# Patient Record
Sex: Male | Born: 1995 | Race: Black or African American | Hispanic: No | Marital: Single | Smoking: Current every day smoker
Health system: Southern US, Community
[De-identification: ages and names within clinical notes are randomized; demographics above are authoritative.]

## PROBLEM LIST (undated history)

## (undated) DIAGNOSIS — A630 Anogenital (venereal) warts: Secondary | ICD-10-CM

## (undated) DIAGNOSIS — A749 Chlamydial infection, unspecified: Secondary | ICD-10-CM

---

## 2018-07-07 ENCOUNTER — Other Ambulatory Visit: Payer: Self-pay

## 2018-07-07 ENCOUNTER — Emergency Department (HOSPITAL_BASED_OUTPATIENT_CLINIC_OR_DEPARTMENT_OTHER)
Admission: EM | Admit: 2018-07-07 | Discharge: 2018-07-08 | Disposition: A | Discharge status: Home / Self Care | Payer: Medicaid Other | Attending: Emergency Medicine | Admitting: Emergency Medicine

## 2018-07-07 DIAGNOSIS — N342 Other urethritis: Secondary | ICD-10-CM | POA: Insufficient documentation

## 2018-07-07 DIAGNOSIS — Z202 Contact with and (suspected) exposure to infections with a predominantly sexual mode of transmission: Secondary | ICD-10-CM | POA: Diagnosis present

## 2018-07-07 NOTE — ED Triage Notes (Signed)
Pt states he is having burning sensation on his groins and with urination and wants to be check for STD

## 2018-07-08 ENCOUNTER — Encounter (HOSPITAL_BASED_OUTPATIENT_CLINIC_OR_DEPARTMENT_OTHER): Payer: Self-pay | Admitting: Emergency Medicine

## 2018-07-08 LAB — URINALYSIS, ROUTINE W REFLEX MICROSCOPIC
Bilirubin Urine: NEGATIVE
Glucose, UA: NEGATIVE mg/dL
Hgb urine dipstick: NEGATIVE
Ketones, ur: NEGATIVE mg/dL
Leukocytes,Ua: NEGATIVE
Nitrite: NEGATIVE
Protein, ur: NEGATIVE mg/dL
Specific Gravity, Urine: >1.03 — ABNORMAL HIGH (ref 1.005–1.030)
pH: 6.5 (ref 5.0–8.0)

## 2018-07-08 MED ORDER — AZITHROMYCIN 250 MG PO TABS
1000.0000 mg | ORAL_TABLET | Freq: Once | ORAL | Status: AC
  Administered 2018-07-08: 1000 mg via ORAL
  Filled 2018-07-08: qty 4

## 2018-07-08 MED ORDER — CEFTRIAXONE SODIUM 250 MG IJ SOLR
250.0000 mg | Freq: Once | INTRAMUSCULAR | Status: AC
  Administered 2018-07-08: 250 mg via INTRAMUSCULAR
  Filled 2018-07-08: qty 250

## 2018-07-08 MED ORDER — LIDOCAINE HCL (PF) 1 % IJ SOLN
INTRAMUSCULAR | Status: AC
  Administered 2018-07-08: 01:00:00 5 mL
  Filled 2018-07-08: qty 5

## 2018-07-08 NOTE — ED Provider Notes (Signed)
[facility] EMERGENCY DEPARTMENT Provider Note   CSN: [id number] Arrival date & time: 07/07/18  2354    History   Chief Complaint Chief Complaint  Patient presents with  . Exposure to STD    HPI Roger Holloway is a 23 y.o. male.     Patient c/o dysuria, and mild penile discharge in past day. +sexually active, no known std exposure. Denies scrotal or testicular pain or swelling. No abd or flank pain. No hematuria. No fever or chills. No nvd. Normal appetite. Denies hx uti. Wants to be checked for std. No specific known ill contacts or std exposure.   The history is provided by the patient.  Exposure to STD  Pertinent negatives include no chest pain, no abdominal pain, no headaches and no shortness of breath.    History reviewed. No pertinent past medical history.  There are no active problems to display for this patient.   History reviewed. No pertinent surgical history.      Home Medications    Prior to Admission medications   Not on File    Family History History reviewed. No pertinent family history.  Social History Social History   Tobacco Use  . Smoking status: Never Smoker  . Smokeless tobacco: Never Used  Substance Use Topics  . Alcohol use: Never    Frequency: Never  . Drug use: Never     Allergies   Patient has no known allergies.   Review of Systems Review of Systems  Constitutional: Negative for fever.  HENT: Negative for sore throat.   Eyes: Negative for redness.  Respiratory: Negative for cough and shortness of breath.   Cardiovascular: Negative for chest pain.  Gastrointestinal: Negative for abdominal pain.  Endocrine: Negative for polyuria.  Genitourinary: Positive for discharge and dysuria. Negative for flank pain.  Musculoskeletal: Negative for back pain.  Skin: Negative for rash.  Neurological: Negative for headaches.  Hematological: Does not bruise/bleed easily.  Psychiatric/Behavioral: Negative for  confusion.     Physical Exam Updated Vital Signs BP 137/72 (BP Location: Right Arm)   Pulse 80   Temp 98.1 F (36.7 C) (Oral)   Resp 18   Ht 1.803 m (5\' 11" )   Wt 81.6 kg   SpO2 96%   BMI 25.10 kg/m   Physical Exam Vitals signs and nursing note reviewed.  Constitutional:      Appearance: Normal appearance. He is well-developed.  HENT:     Head: Atraumatic.     Nose: Nose normal.  Eyes:     General: No scleral icterus.    Conjunctiva/sclera: Conjunctivae normal.  Neck:     Musculoskeletal: Normal range of motion and neck supple.     Trachea: No tracheal deviation.  Cardiovascular:     Rate and Rhythm: Normal rate.     Pulses: Normal pulses.  Pulmonary:     Effort: Pulmonary effort is normal. No accessory muscle usage or respiratory distress.  Abdominal:     General: There is no distension.     Palpations: Abdomen is soft.     Tenderness: There is no abdominal tenderness.  Genitourinary:    Comments: No cva tenderness. Normal external gu exam. No scrotal or testicular pain, swelling or tenderness. Scant penile d/c.  No wounds/ulcers.  Musculoskeletal:        General: No swelling.  Skin:    General: Skin is warm and dry.     Findings: No rash.  Neurological:     Mental Status: He is alert.  Comments: Alert, speech clear.   Psychiatric:        Mood and Affect: Mood normal.      ED Treatments / Results  Labs (all labs ordered are listed, but only abnormal results are displayed) Results for orders placed or performed during the hospital encounter of 07/07/18  Urinalysis, Routine w reflex microscopic  Result Value Ref Range   Color, Urine YELLOW YELLOW   APPearance CLOUDY (A) CLEAR   Specific Gravity, Urine >1.030 (H) 1.005 - 1.030   pH 6.5 5.0 - 8.0   Glucose, UA NEGATIVE NEGATIVE mg/dL   Hgb urine dipstick NEGATIVE NEGATIVE   Bilirubin Urine NEGATIVE NEGATIVE   Ketones, ur NEGATIVE NEGATIVE mg/dL   Protein, ur NEGATIVE NEGATIVE mg/dL   Nitrite  NEGATIVE NEGATIVE   Leukocytes,Ua NEGATIVE NEGATIVE     EKG None  Radiology No results found.  Procedures Procedures (including critical care time)  Medications Ordered in ED Medications  cefTRIAXone (ROCEPHIN) injection 250 mg (has no administration in time range)  azithromycin (ZITHROMAX) tablet 1,000 mg (has no administration in time range)     Initial Impression / Assessment and Plan / ED Course  I have reviewed the triage vital signs and the nursing notes.  Pertinent labs & imaging results that were available during my care of the patient were reviewed by me and considered in my medical decision making (see chart for details).  Labs sent.   Confirmed nkda.   Rocephin im. zitrhomax po.  Reviewed nursing notes and prior charts for additional history.   Labs reviewed by me - ua neg for uti.  rec pcp f/u 1 week.  Return precautions provided.     Final Clinical Impressions(s) / ED Diagnoses   Final diagnoses:  None    ED Discharge Orders    None       Cathren LaineSteinl, Zeth Buday, MD 07/08/18 [phone]

## 2018-07-08 NOTE — Discharge Instructions (Signed)
It was our pleasure to provide your ER care today - we hope that you feel better.  The penile swab tests will take 2-3 days to give results.   You were treated in the ED with antibiotics.   No sex until after symptoms have resolved, then use condoms.  Follow up with primary care doctor in 1 week if symptoms fail to improve/resolve. Also have your blood pressure rechecked then, as it is high today.  Return to ER if worse, new symptoms, fevers, new or severe pain, other concern.

## 2018-07-08 NOTE — ED Notes (Signed)
ED Provider at bedside. 

## 2018-07-08 NOTE — ED Notes (Signed)
No noted rash or reaction from injection

## 2018-07-11 LAB — GC/CHLAMYDIA PROBE AMP (~~LOC~~) NOT AT ARMC
Chlamydia: POSITIVE — AB
Neisseria Gonorrhea: NEGATIVE

## 2018-11-06 ENCOUNTER — Emergency Department (HOSPITAL_BASED_OUTPATIENT_CLINIC_OR_DEPARTMENT_OTHER)
Admission: EM | Admit: 2018-11-06 | Discharge: 2018-11-06 | Disposition: A | Discharge status: Home / Self Care | Payer: Medicaid Other | Attending: Emergency Medicine | Admitting: Emergency Medicine

## 2018-11-06 ENCOUNTER — Encounter (HOSPITAL_BASED_OUTPATIENT_CLINIC_OR_DEPARTMENT_OTHER): Payer: Self-pay | Admitting: *Deleted

## 2018-11-06 ENCOUNTER — Other Ambulatory Visit: Payer: Self-pay

## 2018-11-06 DIAGNOSIS — F17210 Nicotine dependence, cigarettes, uncomplicated: Secondary | ICD-10-CM | POA: Insufficient documentation

## 2018-11-06 DIAGNOSIS — A64 Unspecified sexually transmitted disease: Secondary | ICD-10-CM | POA: Insufficient documentation

## 2018-11-06 DIAGNOSIS — R360 Urethral discharge without blood: Secondary | ICD-10-CM | POA: Diagnosis present

## 2018-11-06 LAB — URINALYSIS, MICROSCOPIC (REFLEX): Squamous Epithelial / HPF: NONE SEEN (ref 0–5)

## 2018-11-06 LAB — URINALYSIS, ROUTINE W REFLEX MICROSCOPIC
Bilirubin Urine: NEGATIVE
Glucose, UA: NEGATIVE mg/dL
Hgb urine dipstick: NEGATIVE
Ketones, ur: NEGATIVE mg/dL
Nitrite: NEGATIVE
Protein, ur: NEGATIVE mg/dL
Specific Gravity, Urine: 1.02 (ref 1.005–1.030)
pH: 7 (ref 5.0–8.0)

## 2018-11-06 MED ORDER — METRONIDAZOLE 500 MG PO TABS
2000.0000 mg | ORAL_TABLET | Freq: Once | ORAL | Status: AC
  Administered 2018-11-06: 2000 mg via ORAL
  Filled 2018-11-06: qty 4

## 2018-11-06 MED ORDER — ONDANSETRON 4 MG PO TBDP
4.0000 mg | ORAL_TABLET | Freq: Once | ORAL | Status: AC
  Administered 2018-11-06: 4 mg via ORAL
  Filled 2018-11-06: qty 1

## 2018-11-06 MED ORDER — AZITHROMYCIN 250 MG PO TABS
1000.0000 mg | ORAL_TABLET | Freq: Once | ORAL | Status: AC
  Administered 2018-11-06: 1000 mg via ORAL
  Filled 2018-11-06: qty 4

## 2018-11-06 MED ORDER — LIDOCAINE HCL (PF) 1 % IJ SOLN
INTRAMUSCULAR | Status: AC
  Administered 2018-11-06: 13:00:00
  Filled 2018-11-06: qty 5

## 2018-11-06 MED ORDER — CEFTRIAXONE SODIUM 250 MG IJ SOLR
250.0000 mg | Freq: Once | INTRAMUSCULAR | Status: AC
  Administered 2018-11-06: 250 mg via INTRAMUSCULAR
  Filled 2018-11-06: qty 250

## 2018-11-06 NOTE — ED Provider Notes (Signed)
[facility] EMERGENCY DEPARTMENT Provider Note   CSN: [id number] Arrival date & time: 11/06/18  1056     History   Chief Complaint No chief complaint on file.   HPI Roger Holloway is a 23 y.o. male.     Patient states he developed a penile discharge yesterday.  Patient with no known STD exposure.  But assumes it is an STD.  No other complaints.  No fever no rash.     History reviewed. No pertinent past medical history.  There are no active problems to display for this patient.   History reviewed. No pertinent surgical history.      Home Medications    Prior to Admission medications   Medication Sig Start Date End Date Taking? Authorizing Provider  valACYclovir (VALTREX) 500 MG tablet Take 500 mg by mouth 2 (two) times daily.    [provider]    Family History No family history on file.  Social History Social History   Tobacco Use  . Smoking status: Current Every Day Smoker  . Smokeless tobacco: Never Used  Substance Use Topics  . Alcohol use: Never    Frequency: Never  . Drug use: Never     Allergies   Patient has no known allergies.   Review of Systems Review of Systems  Constitutional: Negative for chills and fever.  HENT: Negative for congestion, rhinorrhea and sore throat.   Eyes: Negative for visual disturbance.  Respiratory: Negative for cough and shortness of breath.   Cardiovascular: Negative for chest pain and leg swelling.  Gastrointestinal: Negative for abdominal pain, diarrhea, nausea and vomiting.  Genitourinary: Positive for discharge. Negative for difficulty urinating, dysuria, flank pain, genital sores, penile pain, penile swelling, scrotal swelling and testicular pain.  Musculoskeletal: Negative for back pain and neck pain.  Skin: Negative for rash.  Neurological: Negative for dizziness, light-headedness and headaches.  Hematological: Does not bruise/bleed easily.  Psychiatric/Behavioral: Negative for  confusion.     Physical Exam Updated Vital Signs BP 132/79   Pulse 81   Temp 97.9 F (36.6 C) (Oral)   Resp 20   Ht 1.803 m (5\' 11" )   Wt 81.6 kg   SpO2 99%   BMI 25.10 kg/m   Physical Exam Vitals signs and nursing note reviewed.  Constitutional:      Appearance: Normal appearance. He is well-developed.  HENT:     Head: Normocephalic and atraumatic.  Eyes:     Conjunctiva/sclera: Conjunctivae normal.  Neck:     Musculoskeletal: Neck supple.  Cardiovascular:     Rate and Rhythm: Normal rate and regular rhythm.     Heart sounds: No murmur.  Pulmonary:     Effort: Pulmonary effort is normal. No respiratory distress.     Breath sounds: Normal breath sounds.  Abdominal:     Palpations: Abdomen is soft.     Tenderness: There is no abdominal tenderness.  Genitourinary:    Penis: Normal.      Scrotum/Testes: Normal.     Comments: No penile discharge.  Patient is circumcised.  No lesions.  No groin adenopathy.  Testicles nontender.  No evidence of groin hernia. Skin:    General: Skin is warm and dry.  Neurological:     Mental Status: He is alert.      ED Treatments / Results  Labs (all labs ordered are listed, but only abnormal results are displayed) Labs Reviewed  URINALYSIS, ROUTINE W REFLEX MICROSCOPIC - Abnormal; Notable for the following components:  Result Value   Leukocytes,Ua TRACE (*)    All other components within normal limits  URINALYSIS, MICROSCOPIC (REFLEX) - Abnormal; Notable for the following components:   Bacteria, UA MANY (*)    All other components within normal limits  RPR  HIV ANTIBODY (ROUTINE TESTING W REFLEX)    EKG None  Radiology No results found.  Procedures Procedures (including critical care time)  Medications Ordered in ED Medications  cefTRIAXone (ROCEPHIN) injection 250 mg (has no administration in time range)  azithromycin (ZITHROMAX) tablet 1,000 mg (has no administration in time range)  metroNIDAZOLE (FLAGYL)  tablet 2,000 mg (has no administration in time range)  ondansetron (ZOFRAN-ODT) disintegrating tablet 4 mg (has no administration in time range)     Initial Impression / Assessment and Plan / ED Course  I have reviewed the triage vital signs and the nursing notes.  Pertinent labs & imaging results that were available during my care of the patient were reviewed by me and considered in my medical decision making (see chart for details).       Patient with a history of discharge.  No discharge present here today.  Patient refuses to have swab done.  Will treat empirically.  Patient to receive Rocephin azithromycin and Flagyl here.  RPR HIV testing completed.   Final Clinical Impressions(s) / ED Diagnoses   Final diagnoses:  STD (male)    ED Discharge Orders    None       Fredia Sorrow, MD 11/06/18 1239

## 2018-11-06 NOTE — Discharge Instructions (Addendum)
All medications and labs done here today.  Should improve over the next few days.  Return for any new or worse symptoms or if not better.

## 2018-11-06 NOTE — ED Triage Notes (Signed)
Penile discharge x 2 days. No known exposure to an STD.

## 2018-11-06 NOTE — ED Notes (Signed)
Pt refusing to wait the required time following a medication injection. Pt advised to watch for signs and symptoms of allergic reaction and to return or call 9-1-1 if noticed. Discharge written and verbal discharge instructions given to pt.

## 2018-11-07 LAB — HIV ANTIBODY (ROUTINE TESTING W REFLEX): HIV Screen 4th Generation wRfx: NONREACTIVE

## 2018-11-07 LAB — RPR: RPR Ser Ql: NONREACTIVE

## 2019-01-08 ENCOUNTER — Encounter (HOSPITAL_BASED_OUTPATIENT_CLINIC_OR_DEPARTMENT_OTHER): Payer: Self-pay

## 2019-01-08 ENCOUNTER — Other Ambulatory Visit: Payer: Self-pay

## 2019-01-08 DIAGNOSIS — Z202 Contact with and (suspected) exposure to infections with a predominantly sexual mode of transmission: Secondary | ICD-10-CM | POA: Insufficient documentation

## 2019-01-08 DIAGNOSIS — R1030 Lower abdominal pain, unspecified: Secondary | ICD-10-CM | POA: Diagnosis not present

## 2019-01-08 DIAGNOSIS — Z79899 Other long term (current) drug therapy: Secondary | ICD-10-CM | POA: Diagnosis not present

## 2019-01-08 DIAGNOSIS — F17290 Nicotine dependence, other tobacco product, uncomplicated: Secondary | ICD-10-CM | POA: Diagnosis not present

## 2019-01-08 NOTE — ED Triage Notes (Signed)
Pt requesting STD check-denies penile d/c and exposure-NAD-steady gait

## 2019-01-09 ENCOUNTER — Encounter (HOSPITAL_BASED_OUTPATIENT_CLINIC_OR_DEPARTMENT_OTHER): Payer: Self-pay | Admitting: Emergency Medicine

## 2019-01-09 ENCOUNTER — Emergency Department (HOSPITAL_BASED_OUTPATIENT_CLINIC_OR_DEPARTMENT_OTHER)
Admission: EM | Admit: 2019-01-09 | Discharge: 2019-01-09 | Disposition: A | Discharge status: Home / Self Care | Payer: Medicaid Other | Attending: Emergency Medicine | Admitting: Emergency Medicine

## 2019-01-09 ENCOUNTER — Emergency Department (HOSPITAL_BASED_OUTPATIENT_CLINIC_OR_DEPARTMENT_OTHER)
Admission: EM | Admit: 2019-01-09 | Discharge: 2019-01-09 | Disposition: A | Discharge status: Home / Self Care | Payer: Medicaid Other | Source: Home / Self Care | Attending: Emergency Medicine | Admitting: Emergency Medicine

## 2019-01-09 DIAGNOSIS — R590 Localized enlarged lymph nodes: Secondary | ICD-10-CM | POA: Insufficient documentation

## 2019-01-09 DIAGNOSIS — I861 Scrotal varices: Secondary | ICD-10-CM

## 2019-01-09 DIAGNOSIS — Z711 Person with feared health complaint in whom no diagnosis is made: Secondary | ICD-10-CM

## 2019-01-09 DIAGNOSIS — Z79899 Other long term (current) drug therapy: Secondary | ICD-10-CM | POA: Insufficient documentation

## 2019-01-09 DIAGNOSIS — F17290 Nicotine dependence, other tobacco product, uncomplicated: Secondary | ICD-10-CM | POA: Insufficient documentation

## 2019-01-09 HISTORY — DX: Chlamydial infection, unspecified: A74.9

## 2019-01-09 LAB — URINALYSIS, DIPSTICK ONLY
Bilirubin Urine: NEGATIVE
Glucose, UA: NEGATIVE mg/dL
Hgb urine dipstick: NEGATIVE
Ketones, ur: NEGATIVE mg/dL
Leukocytes,Ua: NEGATIVE
Nitrite: NEGATIVE
Protein, ur: NEGATIVE mg/dL
Specific Gravity, Urine: 1.02 (ref 1.005–1.030)
pH: 6 (ref 5.0–8.0)

## 2019-01-09 NOTE — ED Provider Notes (Signed)
[facility] EMERGENCY DEPARTMENT Provider Note   CSN: [id number] Arrival date & time: 01/09/19  1047     History   Chief Complaint Inguinal lymph nodes pain  HPI Roger Holloway is a 23 y.o. male who presented emergency department yesterday being tested for STD, returning to the ED with complaint of inguinal lymph node pain.  He reports he has swollen tender nodes in his bilateral inguinal region for several days.  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  This is why is returning in the morning today.  He was seen here yesterday and evaluated for STD.  He had a urine chlamydia gonorrhea screen sent, the results of which are not back yet.  He was not treated empirically at that time.  He was concerned that he had had a new sexual partner, and that he had had chlamydia in the past, and that he feels like his inguinal pain was similar to the chlamydia.  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  The lesions have since resolved.  He denies nausea, vomiting, fevers, chills, penile drainage.  He denies any testicular pain or swelling.     HPI  Past Medical History:  Diagnosis Date  . Chlamydia     There are no active problems to display for this patient.   History reviewed. No pertinent surgical history.      Home Medications    Prior to Admission medications   Medication Sig Start Date End Date Taking? Authorizing Provider  valACYclovir (VALTREX) 500 MG tablet Take 500 mg by mouth 2 (two) times daily.    [provider]    Family History No family history on file.  Social History Social History   Tobacco Use  . Smoking status: Current Every Day Smoker    Types: Cigars  . Smokeless tobacco: Never Used  Substance Use Topics  . Alcohol use: Never    Frequency: Never  . Drug use: Yes    Types: Marijuana     Allergies   Patient has no known allergies.    Review of Systems Review of Systems  Constitutional: Negative for chills and fever.  Eyes: Negative for photophobia and visual disturbance.  Respiratory: Negative for cough and shortness of breath.   Cardiovascular: Negative for chest pain and palpitations.  Gastrointestinal: Negative for nausea and vomiting.  Genitourinary: Negative for difficulty urinating, discharge, flank pain, penile pain, penile swelling, scrotal swelling and testicular pain.  Skin: Negative for pallor and rash.  Neurological: Negative for syncope and light-headedness.  All other systems reviewed and are negative.    Physical Exam Updated Vital Signs BP 140/75 (BP Location: Right Arm)   Pulse 77   Temp 98.2 F (36.8 C) (Oral)   Resp 16   Ht 5\' 11"  (1.803 m)   Wt 81.6 kg   SpO2 100%   BMI 25.10 kg/m   Physical Exam Vitals signs and nursing note reviewed.  Constitutional:      Appearance: He is well-developed.  HENT:     Head: Normocephalic and atraumatic.  Eyes:     Conjunctiva/sclera: Conjunctivae normal.  Neck:     Musculoskeletal: Neck supple.  Cardiovascular:     Rate and Rhythm: Normal rate and regular rhythm.  Pulmonary:     Effort: Pulmonary effort is normal. No respiratory distress.  Abdominal:     Palpations: Abdomen is  soft.     Tenderness: There is no abdominal tenderness. There is no guarding or rebound.     Comments: +small tender bilateral inguinal lymphadenopathy  Genitourinary:    Comments: Exam performed with nurse chaperone in the room Normal external male genitalia, circumsized male Symmetric and intact bilateral cremasteric reflexes. No penile lesions, rashes, crepitus, drainage. No testicular tenderness or swelling. No epididymal tenderness or swelling. Bilateral varicoceles present. Skin:    General: Skin is warm and dry.  Neurological:     Mental Status: He is alert.  Psychiatric:        Mood and Affect: Mood normal.        Behavior: Behavior normal.      ED  Treatments / Results  Labs (all labs ordered are listed, but only abnormal results are displayed) Labs Reviewed  URINALYSIS, DIPSTICK ONLY    EKG None  Radiology No results found.  Procedures Procedures (including critical care time)  Medications Ordered in ED Medications - No data to display   Initial Impression / Assessment and Plan / ED Course  I have reviewed the triage vital signs and the nursing notes.  Pertinent labs & imaging results that were available during my care of the patient were reviewed by me and considered in my medical decision making (see chart for details).  23 year old male presenting to emergency department bilateral inguinal lymphadenopathy in the setting of recent herpes simplex outbreak approximately 1 week ago.  He has no active herpes lesions visible on his GU exam today.  I explained his lymph nodes are very likely reactive to the infection, and should improve over the next several days.  Advised that he take NSAIDs and ibuprofen for his pain.  He has a urine GC chlamydia test still pending from his visit to the ED last night.  I have a much lower suspicion for an STI at this time.  We agreed to watch and wait for his results rather than initiate antibiotics at this time.  I have a low suspicion for intra-abdominal infection based on my exam.  Finally, we discussed his varicoceles found on his GU exam.  This is a benign condition.  I provided some information to read about.  I do not suspect epididymitis or testicular torsion or abscess based on my exam.     Final Clinical Impressions(s) / ED Diagnoses   Final diagnoses:  Varicocele  Inguinal lymphadenopathy    ED Discharge Orders    None       Wyvonnia Dusky, MD 01/09/19 1210

## 2019-01-09 NOTE — ED Triage Notes (Signed)
Pt c/o lower abd pain, describes as pressure

## 2019-01-09 NOTE — ED Provider Notes (Signed)
[facility] EMERGENCY DEPARTMENT Provider Note  CSN: [id number] Arrival date & time: 01/08/19 2233  Chief Complaint(s) SEXUALLY TRANSMITTED DISEASE  HPI Roger Holloway is a 23 y.o. male who presents with concern for you STD.  Patient is sexually active.  Last sexual encounter was 1 week ago.  Began having lower abdominal discomfort.  Denies any penile discharge or bleeding.  No dysuria or frequency.  No fevers or chills.  No nausea vomiting.  No diarrhea.  Currently asymptomatic.  HPI  Past Medical History History reviewed. No pertinent past medical history. There are no active problems to display for this patient.  Home Medication(s) Prior to Admission medications   Medication Sig Start Date End Date Taking? Authorizing Provider  valACYclovir (VALTREX) 500 MG tablet Take 500 mg by mouth 2 (two) times daily.    [provider]                                                                                                                                    Past Surgical History History reviewed. No pertinent surgical history. Family History No family history on file.  Social History Social History   Tobacco Use  . Smoking status: Current Every Day Smoker    Types: Cigars  . Smokeless tobacco: Never Used  Substance Use Topics  . Alcohol use: Never    Frequency: Never  . Drug use: Yes    Types: Marijuana   Allergies Patient has no known allergies.  Review of Systems Review of Systems As noted above Physical Exam Vital Signs  I have reviewed the triage vital signs BP 128/73 (BP Location: Left Arm)   Pulse 95   Temp 98 F (36.7 C) (Oral)   Resp 18   Ht 5\' 11"  (1.803 m)   Wt 81.6 kg   SpO2 98%   BMI 25.10 kg/m   Physical Exam Vitals signs reviewed.  Constitutional:      General: He is not in acute distress.    Appearance: He is well-developed. He is not diaphoretic.  HENT:     Head: Normocephalic and atraumatic.     Jaw: No trismus.      Right Ear: External ear normal.     Left Ear: External ear normal.     Nose: Nose normal.  Eyes:     General: No scleral icterus.    Conjunctiva/sclera: Conjunctivae normal.  Neck:     Musculoskeletal: Normal range of motion.     Trachea: Phonation normal.  Cardiovascular:     Rate and Rhythm: Normal rate and regular rhythm.  Pulmonary:     Effort: Pulmonary effort is normal. No respiratory distress.     Breath sounds: No stridor.  Abdominal:     General: There is no distension.     Tenderness: There is no abdominal tenderness.  Genitourinary:    Pubic Area: No rash.      Penis: Circumcised.  Scrotum/Testes: Normal.        Right: Tenderness or swelling not present.        Left: Tenderness or swelling not present.  Musculoskeletal: Normal range of motion.  Neurological:     Mental Status: He is alert and oriented to person, place, and time.  Psychiatric:        Behavior: Behavior normal.     ED Results and Treatments Labs (all labs ordered are listed, but only abnormal results are displayed) Labs Reviewed  GC/CHLAMYDIA PROBE AMP ([place]) NOT AT [facility]                                                                                                                         EKG  EKG Interpretation  Date/Time:    Ventricular Rate:    PR Interval:    QRS Duration:   QT Interval:    QTC Calculation:   R Axis:     Text Interpretation:        Radiology No results found.  Pertinent labs & imaging results that were available during my care of the patient were reviewed by me and considered in my medical decision making (see chart for details).  Medications Ordered in ED Medications - No data to display                                                                                                                                  Procedures Procedures  (including critical care time)  Medical Decision Making / ED Course I have reviewed the nursing notes for  this encounter and the patient's prior records (if available in EHR or on provided paperwork).   Roger Holloway was evaluated in Emergency Department on 01/09/2019 for the symptoms described in the history of present illness. He was evaluated in the context of the global COVID-19 pandemic, which necessitated consideration that the patient might be at risk for infection with the SARS-CoV-2 virus that causes COVID-19. Institutional protocols and algorithms that pertain to the evaluation of patients at risk for COVID-19 are in a state of rapid change based on information released by regulatory bodies including the CDC and federal and state organizations. These policies and algorithms were followed during the patient's care in the ED.  Abdominal exam benign.  GU exam benign.  GC/chlamydia urine sent.  No need for treatment at this time.  Will await results and contact  if needed.  The patient appears reasonably screened and/or stabilized for discharge and I doubt any other medical condition or other [facility] requiring further screening, evaluation, or treatment in the ED at this time prior to discharge.  The patient is safe for discharge with strict return precautions.       Final Clinical Impression(s) / ED Diagnoses Final diagnoses:  Concern about STD in male without diagnosis    The patient appears reasonably screened and/or stabilized for discharge and I doubt any other medical condition or other [facility] requiring further screening, evaluation, or treatment in the ED at this time prior to discharge.  Disposition: Discharge  Condition: Good  I have discussed the results, Dx and Tx plan with the patient who expressed understanding and agree(s) with the plan. Discharge instructions discussed at great length. The patient was given strict return precautions who verbalized understanding of the instructions. No further questions at time of discharge.    ED Discharge Orders    None         Follow Up: Department, [facility] [address] [place] [address] [phone]  Go to  As needed      This chart was dictated using voice recognition software.  Despite best efforts to proofread,  errors can occur which can change the documentation meaning.   Nira Connardama, Pedro Eduardo, MD 01/09/19 [phone]

## 2019-01-09 NOTE — Discharge Instructions (Addendum)
You will be contacted if results are positive.  You can check the results on MyChart . Refrain from sexual intercourse for 2 weeks and have all partners evaluated and treated at the local health department. Follow up with the health Department in 2 weeks to confirm effectiveness of treatment and receive additional education/evaluation

## 2019-01-09 NOTE — Discharge Instructions (Addendum)
You can take Motrin, Advil, or ibuprofen at home for your tender lymph nodes.  I suspect that the pain will improve over the course of the next few days.  You can also check your patient chart online over the next 2 to 3 days for the results of your Chlamydia and gonorrhea test.  If you test positive, someone should call you to let you know that your positive need to be treated.  Please note that your urine test today did not show signs of urine tract infection.   Finally, I included information about varicoceles for you to read about.  This is not an emergency condition.  This is simply for your information.

## 2019-01-10 LAB — GC/CHLAMYDIA PROBE AMP (~~LOC~~) NOT AT ARMC
Chlamydia: NEGATIVE
Neisseria Gonorrhea: NEGATIVE

## 2019-01-13 ENCOUNTER — Other Ambulatory Visit: Payer: Self-pay

## 2019-01-13 ENCOUNTER — Encounter: Payer: Self-pay | Admitting: Emergency Medicine

## 2019-01-13 DIAGNOSIS — F1210 Cannabis abuse, uncomplicated: Secondary | ICD-10-CM | POA: Diagnosis not present

## 2019-01-13 DIAGNOSIS — A6002 Herpesviral infection of other male genital organs: Secondary | ICD-10-CM | POA: Diagnosis not present

## 2019-01-13 DIAGNOSIS — F17290 Nicotine dependence, other tobacco product, uncomplicated: Secondary | ICD-10-CM | POA: Insufficient documentation

## 2019-01-13 DIAGNOSIS — Z113 Encounter for screening for infections with a predominantly sexual mode of transmission: Secondary | ICD-10-CM | POA: Diagnosis present

## 2019-01-13 NOTE — ED Triage Notes (Signed)
Patient states that he is here for an STD check. Patient states that he was seen at [facility] on Wed for the same. Patient with complaint of lower abdominal pressure that started on Tuesday.

## 2019-01-14 ENCOUNTER — Emergency Department
Admission: EM | Admit: 2019-01-14 | Discharge: 2019-01-14 | Disposition: A | Discharge status: Home / Self Care | Payer: Medicaid Other | Attending: Emergency Medicine | Admitting: Emergency Medicine

## 2019-01-14 DIAGNOSIS — A6002 Herpesviral infection of other male genital organs: Secondary | ICD-10-CM

## 2019-01-14 HISTORY — DX: Anogenital (venereal) warts: A63.0

## 2019-01-14 LAB — URINALYSIS, COMPLETE (UACMP) WITH MICROSCOPIC
Bacteria, UA: NONE SEEN
Bilirubin Urine: NEGATIVE
Glucose, UA: NEGATIVE mg/dL
Hgb urine dipstick: NEGATIVE
Ketones, ur: NEGATIVE mg/dL
Leukocytes,Ua: NEGATIVE
Nitrite: NEGATIVE
Protein, ur: NEGATIVE mg/dL
Specific Gravity, Urine: 1.02 (ref 1.005–1.030)
Squamous Epithelial / HPF: NONE SEEN (ref 0–5)
WBC, UA: NONE SEEN WBC/hpf (ref 0–5)
pH: 6 (ref 5.0–8.0)

## 2019-01-14 LAB — COMPREHENSIVE METABOLIC PANEL
ALT: 20 U/L (ref 0–44)
AST: 19 U/L (ref 15–41)
Albumin: 4.8 g/dL (ref 3.5–5.0)
Alkaline Phosphatase: 60 U/L (ref 38–126)
Anion gap: 10 (ref 5–15)
BUN: 11 mg/dL (ref 6–20)
CO2: 28 mmol/L (ref 22–32)
Calcium: 9.6 mg/dL (ref 8.9–10.3)
Chloride: 102 mmol/L (ref 98–111)
Creatinine, Ser: 0.96 mg/dL (ref 0.61–1.24)
GFR calc Af Amer: >60 mL/min (ref >60)
GFR calc non Af Amer: >60 mL/min (ref >60)
Glucose, Bld: 103 mg/dL — ABNORMAL HIGH (ref 70–99)
Potassium: 3.9 mmol/L (ref 3.5–5.1)
Sodium: 140 mmol/L (ref 135–145)
Total Bilirubin: 0.8 mg/dL (ref 0.3–1.2)
Total Protein: 8 g/dL (ref 6.5–8.1)

## 2019-01-14 LAB — CBC
HCT: 46.9 % (ref 39.0–52.0)
Hemoglobin: 16.4 g/dL (ref 13.0–17.0)
MCH: 30.8 pg (ref 26.0–34.0)
MCHC: 35 g/dL (ref 30.0–36.0)
MCV: 88.2 fL (ref 80.0–100.0)
Platelets: 254 10*3/uL (ref 150–400)
RBC: 5.32 MIL/uL (ref 4.22–5.81)
RDW: 12.4 % (ref 11.5–15.5)
WBC: 4.1 10*3/uL (ref 4.0–10.5)
nRBC: 0 % (ref 0.0–0.2)

## 2019-01-14 LAB — LIPASE, BLOOD: Lipase: 32 U/L (ref 11–51)

## 2019-01-14 NOTE — ED Provider Notes (Signed)
[facility] Emergency Department Provider Note _____   First MD Initiated Contact with Patient 01/14/19 0222     (approximate)  I have reviewed the triage vital signs and the nursing notes.   HISTORY  Chief Complaint Abdominal Pain    HPI Roger Holloway is a 23 y.o. male with previous history of genital herpes chlamydia genital warts presents to the emergency department requesting to be checked for an STD.  Patient denies any dysuria no penile discharge.  Patient denies any fever no rash.  Patient does admit to unprotected sex 2 weeks ago.  Patient does admit to a recent herpes outbreak for which he is taking Valtrex and states that it has not resolved as quickly as before.     Past Medical History:  Diagnosis Date  . Chlamydia   . Genital warts     There are no active problems to display for this patient.   History reviewed. No pertinent surgical history.  Prior to Admission medications   Medication Sig Start Date End Date Taking? Authorizing Provider  valACYclovir (VALTREX) 500 MG tablet Take 500 mg by mouth 2 (two) times daily.    [provider]    Allergies Patient has no known allergies.  No family history on file.  Social History Social History   Tobacco Use  . Smoking status: Current Every Day Smoker    Types: Cigars  . Smokeless tobacco: Never Used  Substance Use Topics  . Alcohol use: Yes    Frequency: Never  . Drug use: Yes    Types: Marijuana    Review of Systems Constitutional: No fever/chills Eyes: No visual changes. ENT: No sore throat. Cardiovascular: Denies chest pain. Respiratory: Denies shortness of breath. Gastrointestinal: No abdominal pain.  No nausea, no vomiting.  No diarrhea.  No constipation. Genitourinary: Negative for dysuria. Musculoskeletal: Negative for neck pain.  Negative for back pain. Integumentary: Negative for rash. Neurological: Negative for headaches, focal weakness or  numbness.  ____________________________________________   PHYSICAL EXAM:  VITAL SIGNS: ED Triage Vitals [01/13/19 2358]  Enc Vitals Group     BP (!) 146/81     Pulse Rate 90     Resp 18     Temp 98.2 F (36.8 C)     Temp Source Oral     SpO2 98 %     Weight 81.6 kg (180 lb)     Height 1.803 m (5\' 11" )     Head Circumference      Peak Flow      Pain Score 6     Pain Loc      Pain Edu?      Excl. in GC?     Constitutional: Alert and oriented.  Eyes: Conjunctivae are normal.  Mouth/Throat: Patient is wearing a mask. Neck: No stridor.  No meningeal signs.   Cardiovascular: Normal rate, regular rhythm. Good peripheral circulation. Grossly normal heart sounds. Respiratory: Normal respiratory effort.  No retractions. Gastrointestinal: Soft and nontender. No distention.  Genitourinary: Shallow-based ulcers noted on the base of the glans penis consistent with reported recent herpes outbreak Musculoskeletal: No lower extremity tenderness nor edema. No gross deformities of extremities. Neurologic:  Normal speech and language. No gross focal neurologic deficits are appreciated.  Skin:  Skin is warm, dry and intact. Psychiatric: Mood and affect are normal. Speech and behavior are normal.  ____________________________________________   LABS (all labs ordered are listed, but only abnormal results are displayed)  Labs Reviewed  COMPREHENSIVE METABOLIC  PANEL - Abnormal; Notable for the following components:      Result Value   Glucose, Bld 103 (*)    All other components within normal limits  URINALYSIS, COMPLETE (UACMP) WITH MICROSCOPIC - Abnormal; Notable for the following components:   Color, Urine YELLOW (*)    APPearance HAZY (*)    All other components within normal limits  LIPASE, BLOOD  CBC     Procedures   ____________________________________________   INITIAL IMPRESSION / MDM / ASSESSMENT AND PLAN / ED COURSE  As part of my medical decision making, I  reviewed the following data within the electronic MEDICAL RECORD NUMBER   23 year old male presented with above-stated history and physical exam requesting STD evaluation.  Chart review revealed that the patient was checked for gonorrhea and chlamydia on 01/09/2019 and syphilis and HIV in September all of which were negative.      ____________________________________________  FINAL CLINICAL IMPRESSION(S) / ED DIAGNOSES  Final diagnoses:  Herpes genitalis in men     MEDICATIONS GIVEN DURING THIS VISIT:  Medications - No data to display   ED Discharge Orders    None      *Please note:  Olis Kunkle was evaluated in Emergency Department on 01/14/2019 for the symptoms described in the history of present illness. He was evaluated in the context of the global COVID-19 pandemic, which necessitated consideration that the patient might be at risk for infection with the SARS-CoV-2 virus that causes COVID-19. Institutional protocols and algorithms that pertain to the evaluation of patients at risk for COVID-19 are in a state of rapid change based on information released by regulatory bodies including the CDC and federal and state organizations. These policies and algorithms were followed during the patient's care in the ED.  Some ED evaluations and interventions may be delayed as a result of limited staffing during the pandemic.*  Note:  This document was prepared using Dragon voice recognition software and may include unintentional dictation errors.   Gregor Hams, MD 01/14/19 [phone]

## 2019-01-14 NOTE — ED Notes (Signed)
No peripheral IV placed this visit.   Discharge instructions reviewed with patient. Questions fielded by this RN. Patient verbalizes understanding of instructions. Patient discharged home in stable condition per brown. No acute distress noted at time of discharge.   Pt given cell phone and charger from rthe computer plug in

## 2019-01-14 NOTE — ED Notes (Addendum)
ED Provider at bedside.  Pt reports "heaviness feeling" in left lower abdomen, reports herpes outbreak (blisters) last week and pt taking Valtrex for same  Pt reports no pain to abdominal palpation, penis appears with distal ulcers at the glans - dorsal and ventral   Pt reports hx of multiple partners without condom use

## 2019-09-07 ENCOUNTER — Emergency Department (HOSPITAL_BASED_OUTPATIENT_CLINIC_OR_DEPARTMENT_OTHER)
Admission: EM | Admit: 2019-09-07 | Discharge: 2019-09-07 | Disposition: A | Discharge status: Home / Self Care | Payer: Medicaid Other | Attending: Emergency Medicine | Admitting: Emergency Medicine

## 2019-09-07 ENCOUNTER — Encounter (HOSPITAL_BASED_OUTPATIENT_CLINIC_OR_DEPARTMENT_OTHER): Payer: Self-pay | Admitting: Emergency Medicine

## 2019-09-07 ENCOUNTER — Other Ambulatory Visit: Payer: Self-pay

## 2019-09-07 DIAGNOSIS — N342 Other urethritis: Secondary | ICD-10-CM | POA: Diagnosis not present

## 2019-09-07 DIAGNOSIS — R369 Urethral discharge, unspecified: Secondary | ICD-10-CM | POA: Diagnosis present

## 2019-09-07 DIAGNOSIS — F17290 Nicotine dependence, other tobacco product, uncomplicated: Secondary | ICD-10-CM | POA: Insufficient documentation

## 2019-09-07 LAB — HIV ANTIBODY (ROUTINE TESTING W REFLEX): HIV Screen 4th Generation wRfx: NONREACTIVE

## 2019-09-07 LAB — RPR: RPR Ser Ql: NONREACTIVE

## 2019-09-07 MED ORDER — DOXYCYCLINE HYCLATE 100 MG PO CAPS: 100.0000 mg | ORAL_CAPSULE | Freq: Two times a day (BID) | ORAL | 0 refills | Status: DC

## 2019-09-07 MED ORDER — DOXYCYCLINE HYCLATE 100 MG PO TABS
100.0000 mg | ORAL_TABLET | Freq: Once | ORAL | Status: AC
  Administered 2019-09-07: 100 mg via ORAL
  Filled 2019-09-07: qty 1

## 2019-09-07 MED ORDER — LIDOCAINE HCL (PF) 1 % IJ SOLN
1.0000 mL | Freq: Once | INTRAMUSCULAR | Status: AC
  Administered 2019-09-07: 1 mL via INTRADERMAL
  Filled 2019-09-07: qty 5

## 2019-09-07 MED ORDER — CEFTRIAXONE SODIUM 500 MG IJ SOLR
500.0000 mg | Freq: Once | INTRAMUSCULAR | Status: AC
  Administered 2019-09-07: 500 mg via INTRAMUSCULAR
  Filled 2019-09-07: qty 500

## 2019-09-07 NOTE — ED Triage Notes (Signed)
Pt reports penile discharge x1 week. Reports similar to previous chlamydia

## 2019-09-07 NOTE — ED Provider Notes (Signed)
[facility] Provider Note: Lowella Dell, MD, FACEP  CSN: [id number] MRN: [id number] ARRIVAL: 09/07/19 at 0528 ROOM: [facility]   CHIEF COMPLAINT  Penile Discharge   HISTORY OF PRESENT ILLNESS  09/07/19 6:25 AM Roger Holloway is a 24 y.o. male with a 1 week history of a clear urethral discharge.  Symptoms are similar to previous chlamydia.  He has had little dysuria which comes and goes.  It is not severe.  He denies abdominal pain.   Past Medical History:  Diagnosis Date   Chlamydia    Genital warts     History reviewed. No pertinent surgical history.  History reviewed. No pertinent family history.  Social History   Tobacco Use   Smoking status: Current Every Day Smoker    Types: Cigars   Smokeless tobacco: Never Used  Vaping Use   Vaping Use: Never used  Substance Use Topics   Alcohol use: Yes   Drug use: Yes    Types: Marijuana    Prior to Admission medications   Medication Sig Start Date End Date Taking? Authorizing Provider  doxycycline (VIBRAMYCIN) 100 MG capsule Take 1 capsule (100 mg total) by mouth 2 (two) times daily. One po bid x 7 days 09/07/19   Matix Henshaw, MD  valACYclovir (VALTREX) 500 MG tablet Take 500 mg by mouth 2 (two) times daily.    [provider]    Allergies Patient has no known allergies.   REVIEW OF SYSTEMS  Negative except as noted here or in the History of Present Illness.   PHYSICAL EXAMINATION  Initial Vital Signs Blood pressure (!) 131/91, pulse 94, temperature 99 F (37.2 C), temperature source Oral, resp. rate 18, SpO2 97 %.  Examination General: Well-developed, well-nourished male in no acute distress; appearance consistent with age of record HENT: normocephalic; atraumatic Eyes: Normal appearance Neck: supple Heart: regular rate and rhythm Lungs: clear to auscultation bilaterally Abdomen: soft; nondistended; nontender; bowel sounds present GU: Tanner V male, circumcised; no  obvious urethral discharge (patient just urinated) Extremities: No deformity; full range of motion; pulses normal Neurologic: Awake, alert and oriented; motor function intact in all extremities and symmetric; no facial droop Skin: Warm and dry Psychiatric: Normal mood and affect   RESULTS  Summary of this visit's results, reviewed and interpreted by myself:   EKG Interpretation  Date/Time:    Ventricular Rate:    PR Interval:    QRS Duration:   QT Interval:    QTC Calculation:   R Axis:     Text Interpretation:        Laboratory Studies: No results found for this or any previous visit (from the past 24 hour(s)). Imaging Studies: No results found.  ED COURSE and MDM  Nursing notes, initial and subsequent vitals signs, including pulse oximetry, reviewed and interpreted by myself.  Vitals:   09/07/19 0546  BP: (!) 131/91  Pulse: 94  Resp: 18  Temp: 99 F (37.2 C)  TempSrc: Oral  SpO2: 97%   Medications  cefTRIAXone (ROCEPHIN) injection 500 mg (has no administration in time range)  doxycycline (VIBRA-TABS) tablet 100 mg (has no administration in time range)    We will treat for gonorrhea and chlamydia.  We will follow the CDC guidelines for treatment of chlamydia with 1 week of doxycycline twice daily.  The patient states he will be compliant.  PROCEDURES  Procedures   ED DIAGNOSES     ICD-10-CM   1. Urethritis  N34.2  Taos Tapp, Jonny Ruiz, MD 09/07/19 [phone]

## 2019-09-10 LAB — GC/CHLAMYDIA PROBE AMP (~~LOC~~) NOT AT ARMC
Chlamydia: POSITIVE — AB
Comment: NEGATIVE
Comment: NORMAL
Neisseria Gonorrhea: NEGATIVE

## 2020-03-29 ENCOUNTER — Emergency Department (HOSPITAL_BASED_OUTPATIENT_CLINIC_OR_DEPARTMENT_OTHER)
Admission: EM | Admit: 2020-03-29 | Discharge: 2020-03-29 | Disposition: A | Discharge status: Home / Self Care | Payer: Medicaid Other | Attending: Emergency Medicine | Admitting: Emergency Medicine

## 2020-03-29 ENCOUNTER — Other Ambulatory Visit: Payer: Self-pay

## 2020-03-29 ENCOUNTER — Encounter (HOSPITAL_BASED_OUTPATIENT_CLINIC_OR_DEPARTMENT_OTHER): Payer: Self-pay | Admitting: Emergency Medicine

## 2020-03-29 DIAGNOSIS — F17290 Nicotine dependence, other tobacco product, uncomplicated: Secondary | ICD-10-CM | POA: Diagnosis not present

## 2020-03-29 DIAGNOSIS — Z202 Contact with and (suspected) exposure to infections with a predominantly sexual mode of transmission: Secondary | ICD-10-CM | POA: Diagnosis not present

## 2020-03-29 DIAGNOSIS — R369 Urethral discharge, unspecified: Secondary | ICD-10-CM | POA: Diagnosis present

## 2020-03-29 DIAGNOSIS — N341 Nonspecific urethritis: Secondary | ICD-10-CM | POA: Diagnosis not present

## 2020-03-29 DIAGNOSIS — N342 Other urethritis: Secondary | ICD-10-CM

## 2020-03-29 LAB — URINALYSIS, ROUTINE W REFLEX MICROSCOPIC
Bilirubin Urine: NEGATIVE
Glucose, UA: NEGATIVE mg/dL
Hgb urine dipstick: NEGATIVE
Ketones, ur: 40 mg/dL — AB
Leukocytes,Ua: NEGATIVE
Nitrite: NEGATIVE
Protein, ur: NEGATIVE mg/dL
Specific Gravity, Urine: >1.03 — ABNORMAL HIGH (ref 1.005–1.030)
pH: 6 (ref 5.0–8.0)

## 2020-03-29 MED ORDER — DOXYCYCLINE HYCLATE 100 MG PO CAPS: 100.0000 mg | ORAL_CAPSULE | Freq: Two times a day (BID) | ORAL | 0 refills | Status: DC

## 2020-03-29 MED ORDER — LIDOCAINE HCL 1 % IJ SOLN: 1.0000 mg | Freq: Once | INTRAMUSCULAR | Status: AC

## 2020-03-29 MED ORDER — CEFTRIAXONE SODIUM 500 MG IJ SOLR
500.0000 mg | Freq: Once | INTRAMUSCULAR | Status: AC
  Administered 2020-03-29: 500 mg via INTRAMUSCULAR
  Filled 2020-03-29: qty 500

## 2020-03-29 MED ORDER — LIDOCAINE HCL (PF) 1 % IJ SOLN
INTRAMUSCULAR | Status: AC
  Administered 2020-03-29: 1 mg via INTRADERMAL
  Filled 2020-03-29: qty 5

## 2020-03-29 MED ORDER — DOXYCYCLINE HYCLATE 100 MG PO TABS
100.0000 mg | ORAL_TABLET | Freq: Once | ORAL | Status: AC
  Administered 2020-03-29: 100 mg via ORAL
  Filled 2020-03-29: qty 1

## 2020-03-29 NOTE — ED Provider Notes (Signed)
[facility] EMERGENCY DEPARTMENT Provider Note   CSN: [id number] Arrival date & time: 03/29/20  0242     History Chief Complaint  Patient presents with  . Exposure to STD    Roger Holloway is a 25 y.o. male.  Patient with history of gonorrhea, chlamydia and genital herpes here with a 4-day history of soreness to his bilateral thighs after having intercourse.  This is since resolved.  Subsequent to this, he developed a "funny feeling" to his left groin and lower abdomen which is also resolved but continues to come and go.  He describes a remittent soreness, and burning.  Denies any pain with urination or blood in the urine.  Has had some clear penile discharge.  No fever or vomiting.  No focal weakness, numbness or tingling.  No back pain or abdominal pain. Does admit to new sexual partner for Has not seen any herpes vesicles  The history is provided by the patient.  Exposure to STD Pertinent negatives include no chest pain, no abdominal pain, no headaches and no shortness of breath.       Past Medical History:  Diagnosis Date  . Chlamydia   . Genital warts     There are no problems to display for this patient.   History reviewed. No pertinent surgical history.     History reviewed. No pertinent family history.  Social History   Tobacco Use  . Smoking status: Current Every Day Smoker    Types: Cigars  . Smokeless tobacco: Never Used  Vaping Use  . Vaping Use: Never used  Substance Use Topics  . Alcohol use: Yes  . Drug use: Yes    Types: Marijuana    Home Medications Prior to Admission medications   Medication Sig Start Date End Date Taking? Authorizing Provider  doxycycline (VIBRAMYCIN) 100 MG capsule Take 1 capsule (100 mg total) by mouth 2 (two) times daily. One po bid x 7 days 09/07/19   Molpus, John, MD  valACYclovir (VALTREX) 500 MG tablet Take 500 mg by mouth 2 (two) times daily.    [provider]    Allergies    Patient has  no known allergies.  Review of Systems   Review of Systems  Constitutional: Negative for activity change, appetite change and fever.  HENT: Negative for congestion and rhinorrhea.   Respiratory: Negative for cough, chest tightness and shortness of breath.   Cardiovascular: Negative for chest pain.  Gastrointestinal: Negative for abdominal pain, nausea and vomiting.  Genitourinary: Positive for genital sores, penile discharge and penile pain. Negative for dysuria and testicular pain.  Musculoskeletal: Positive for arthralgias and myalgias. Negative for back pain.  Skin: Negative for rash.  Neurological: Negative for dizziness, weakness and headaches.   all other systems are negative except as noted in the HPI and PMH.    Physical Exam Updated Vital Signs BP 118/69 (BP Location: Right Arm)   Pulse (!) 115   Temp 98.3 F (36.8 C) (Oral)   Resp 20   Ht 5\' 11"  (1.803 m)   Wt 81.6 kg   SpO2 100%   BMI 25.10 kg/m   Physical Exam Vitals and nursing note reviewed.  Constitutional:      General: He is not in acute distress.    Appearance: He is well-developed and well-nourished.  HENT:     Head: Normocephalic and atraumatic.     Mouth/Throat:     Mouth: Oropharynx is clear and moist.     Pharynx: No oropharyngeal  exudate.  Eyes:     Extraocular Movements: EOM normal.     Conjunctiva/sclera: Conjunctivae normal.     Pupils: Pupils are equal, round, and reactive to light.  Neck:     Comments: No meningismus. Cardiovascular:     Rate and Rhythm: Normal rate and regular rhythm.     Pulses: Intact distal pulses.     Heart sounds: Normal heart sounds. No murmur heard.   Pulmonary:     Effort: Pulmonary effort is normal. No respiratory distress.     Breath sounds: Normal breath sounds.  Abdominal:     Palpations: Abdomen is soft.     Tenderness: There is no abdominal tenderness. There is no guarding or rebound.  Genitourinary:    Comments: Circumcised, no vesicles seen.  No  rashes or genital lesions.  No lymphadenopathy.  No testicular tenderness Musculoskeletal:        General: No tenderness or edema. Normal range of motion.     Cervical back: Normal range of motion and neck supple.  Skin:    General: Skin is warm.  Neurological:     Mental Status: He is alert and oriented to person, place, and time.     Cranial Nerves: No cranial nerve deficit.     Motor: No abnormal muscle tone.     Coordination: Coordination normal.     Comments: No ataxia on finger to nose bilaterally. No pronator drift. 5/5 strength throughout. CN 2-12 intact.Equal grip strength. Sensation intact.   Psychiatric:        Mood and Affect: Mood and affect normal.        Behavior: Behavior normal.     ED Results / Procedures / Treatments   Labs (all labs ordered are listed, but only abnormal results are displayed) Labs Reviewed  URINALYSIS, ROUTINE W REFLEX MICROSCOPIC - Abnormal; Notable for the following components:      Result Value   Specific Gravity, Urine >1.030 (*)    Ketones, ur 40 (*)    All other components within normal limits  URINE CULTURE    EKG None  Radiology No results found.  Procedures Procedures   Medications Ordered in ED Medications - No data to display  ED Course  I have reviewed the triage vital signs and the nursing notes.  Pertinent labs & imaging results that were available during my care of the patient were reviewed by me and considered in my medical decision making (see chart for details).    MDM Rules/Calculators/A&P                         Groin discomfort with history of STDs and recent unprotected sex with penile discharge.  Lower extremities are neurologically intact.  Low suspicion for cord compression or cauda equina.  Equal distal pulses, reflexes and strength and sensation.  Discussed possibility of herpes outbreak yet not visible.  We will treat empirically for gonorrhea and chlamydia and urethritis.  Safe sex practices  discussed Followup with health department.  Return precautions discussed Final Clinical Impression(s) / ED Diagnoses Final diagnoses:  Urethritis    Rx / DC Orders ED Discharge Orders    None       Tracer Gutridge, Jeannett Senior, MD 03/29/20 [phone]

## 2020-03-29 NOTE — ED Triage Notes (Signed)
Pt state had soreness in legs that progressed to groin discomfort and burning, no burning with urination but has seen clear discharge.

## 2020-03-29 NOTE — ED Notes (Signed)
Assumed care of this patient. Pt reports noticing pain in groin area with discharge since Sunday. Vitals taken. A&Ox4. Medicated per MAR. NAD. Connected to BP and Pulse Ox. Will continue to monitor.

## 2020-03-29 NOTE — Discharge Instructions (Signed)
Use a condom with every sexual encounter.  Take the antibiotics as prescribed.  Follow-up with health department.  Return to the ED with new or worsening symptoms.

## 2020-03-30 LAB — URINE CULTURE: Culture: NO GROWTH

## 2020-05-13 ENCOUNTER — Emergency Department (HOSPITAL_BASED_OUTPATIENT_CLINIC_OR_DEPARTMENT_OTHER)
Admission: EM | Admit: 2020-05-13 | Discharge: 2020-05-13 | Disposition: A | Discharge status: Home / Self Care | Payer: Medicaid Other | Attending: Emergency Medicine | Admitting: Emergency Medicine

## 2020-05-13 ENCOUNTER — Encounter (HOSPITAL_BASED_OUTPATIENT_CLINIC_OR_DEPARTMENT_OTHER): Payer: Self-pay

## 2020-05-13 ENCOUNTER — Other Ambulatory Visit: Payer: Self-pay

## 2020-05-13 DIAGNOSIS — F17290 Nicotine dependence, other tobacco product, uncomplicated: Secondary | ICD-10-CM | POA: Diagnosis not present

## 2020-05-13 DIAGNOSIS — R040 Epistaxis: Secondary | ICD-10-CM

## 2020-05-13 MED ORDER — SALINE SPRAY 0.65 % NA SOLN: 1.0000 | NASAL | 0 refills | Status: AC | PRN

## 2020-05-13 MED ORDER — AFRIN NASAL SPRAY 0.05 % NA SOLN: 1.0000 | Freq: Two times a day (BID) | NASAL | 0 refills | Status: AC | PRN

## 2020-05-13 NOTE — Discharge Instructions (Signed)
If you have any significant persistent nosebleed, spray Afrin into that nostril twice and hold pressure as instructed for 10 minutes.  Repeat this 2 times.  If you having difficulty stopping nosebleed please come back for evaluation.  Otherwise use nasal saline spray as needed to help with providing moisture to your nostrils.  Please avoid any nose picking or trauma to the nose or blowing your nose if not needed.

## 2020-05-13 NOTE — ED Provider Notes (Signed)
[facility] EMERGENCY DEPARTMENT Provider Note   CSN: [id number] Arrival date & time: 05/13/20  1100     History Chief Complaint  Patient presents with  . Epistaxis    Roger Holloway is a 25 y.o. male.   Epistaxis Location:  L nare Severity:  Mild Progression:  Resolved Chronicity:  Recurrent Relieved by:  Nothing Worsened by:  Nothing Associated symptoms: congestion   Associated symptoms: no sore throat   Risk factors: no frequent nosebleeds        Past Medical History:  Diagnosis Date  . Chlamydia   . Genital warts     There are no problems to display for this patient.   History reviewed. No pertinent surgical history.     History reviewed. No pertinent family history.  Social History   Tobacco Use  . Smoking status: Current Every Day Smoker    Types: Cigars  . Smokeless tobacco: Never Used  Vaping Use  . Vaping Use: Never used  Substance Use Topics  . Alcohol use: Yes  . Drug use: Yes    Types: Marijuana    Home Medications Prior to Admission medications   Medication Sig Start Date End Date Taking? Authorizing Provider  oxymetazoline (AFRIN NASAL SPRAY) 0.05 % nasal spray Place 1 spray into both nostrils 2 (two) times daily as needed for congestion (USE FOR NOSEBLEEDS). 05/13/20  Yes Luticia Tadros, DO  sodium chloride (OCEAN) 0.65 % SOLN nasal spray Place 1 spray into both nostrils as needed for congestion. 05/13/20  Yes Cheril Slattery, DO  doxycycline (VIBRAMYCIN) 100 MG capsule Take 1 capsule (100 mg total) by mouth 2 (two) times daily. 03/29/20   Rancour, Jeannett Senior, MD  valACYclovir (VALTREX) 500 MG tablet Take 500 mg by mouth 2 (two) times daily.    [provider]    Allergies    Patient has no known allergies.  Review of Systems   Review of Systems  HENT: Positive for congestion and nosebleeds. Negative for sore throat.   Hematological: Does not bruise/bleed easily.    Physical Exam Updated Vital Signs  ED  Triage Vitals  Enc Vitals Group     BP 05/13/20 1110 (!) 143/92     Pulse Rate 05/13/20 1110 (!) 106     Resp 05/13/20 1110 18     Temp 05/13/20 1115 98.3 F (36.8 C)     Temp Source 05/13/20 1115 Oral     SpO2 05/13/20 1110 100 %     Weight 05/13/20 1112 180 lb 12.4 oz (82 kg)     Height 05/13/20 1112 5\' 11"  (1.803 m)     Head Circumference --      Peak Flow --      Pain Score 05/13/20 1110 0     Pain Loc --      Pain Edu? --      Excl. in GC? --     Physical Exam Constitutional:      General: He is not in acute distress. HENT:     Nose: Congestion present.     Comments: Left turbinate swollen and red, no nasal polyps, no active nosebleed Neurological:     Mental Status: He is alert.     ED Results / Procedures / Treatments   Labs (all labs ordered are listed, but only abnormal results are displayed) Labs Reviewed - No data to display  EKG None  Radiology No results found.  Procedures Procedures   Medications Ordered in ED Medications - No  data to display  ED Course  I have reviewed the triage vital signs and the nursing notes.  Pertinent labs & imaging results that were available during my care of the patient were reviewed by me and considered in my medical decision making (see chart for details).    MDM Rules/Calculators/A&P                          Zong Weissberg is a 25 year old male who presents the ED with nosebleed.  Has resolved.  Has had a lot of congestion/allergy type symptoms.  Left turbinate is slightly swollen and red.  No active bleeding.  Educated about how to control nosebleeds.  Will prescribe Afrin to use as needed.  Will prescribe nasal saline spray.  No history of bleeding issues.  Blood pressure overall unremarkable.  Given reassurance and discharged in ED in good condition.  This chart was dictated using voice recognition software.  Despite best efforts to proofread,  errors can occur which can change the documentation meaning.     Final Clinical Impression(s) / ED Diagnoses Final diagnoses:  Epistaxis    Rx / DC Orders ED Discharge Orders         Ordered    oxymetazoline (AFRIN NASAL SPRAY) 0.05 % nasal spray  2 times daily PRN        05/13/20 1117    sodium chloride (OCEAN) 0.65 % SOLN nasal spray  As needed        05/13/20 1117           Diamond Jentz, DO 05/13/20 1120

## 2020-05-13 NOTE — ED Triage Notes (Signed)
Pt arrives c/o frequent nose bleeds and congestion. Pt states when he blows his nose it starts bleeding again. Bleeding controlled during triage.

## 2020-09-22 ENCOUNTER — Emergency Department (HOSPITAL_BASED_OUTPATIENT_CLINIC_OR_DEPARTMENT_OTHER)
Admission: EM | Admit: 2020-09-22 | Discharge: 2020-09-22 | Disposition: A | Discharge status: Home / Self Care | Payer: BC Managed Care – PPO | Attending: Emergency Medicine | Admitting: Emergency Medicine

## 2020-09-22 ENCOUNTER — Encounter (HOSPITAL_BASED_OUTPATIENT_CLINIC_OR_DEPARTMENT_OTHER): Payer: Self-pay | Admitting: Urology

## 2020-09-22 ENCOUNTER — Emergency Department (HOSPITAL_BASED_OUTPATIENT_CLINIC_OR_DEPARTMENT_OTHER): Payer: BC Managed Care – PPO

## 2020-09-22 DIAGNOSIS — F17290 Nicotine dependence, other tobacco product, uncomplicated: Secondary | ICD-10-CM | POA: Insufficient documentation

## 2020-09-22 DIAGNOSIS — S99921A Unspecified injury of right foot, initial encounter: Secondary | ICD-10-CM | POA: Insufficient documentation

## 2020-09-22 DIAGNOSIS — Y990 Civilian activity done for income or pay: Secondary | ICD-10-CM | POA: Diagnosis not present

## 2020-09-22 DIAGNOSIS — W231XXA Caught, crushed, jammed, or pinched between stationary objects, initial encounter: Secondary | ICD-10-CM | POA: Diagnosis not present

## 2020-09-22 DIAGNOSIS — S9031XA Contusion of right foot, initial encounter: Secondary | ICD-10-CM

## 2020-09-22 MED ORDER — IBUPROFEN 800 MG PO TABS
800.0000 mg | ORAL_TABLET | Freq: Once | ORAL | Status: AC
  Administered 2020-09-22: 800 mg via ORAL
  Filled 2020-09-22: qty 1

## 2020-09-22 NOTE — ED Notes (Signed)
Pt via pov from work with right foot pain after a pallet jack ran over it. Pt has some swelling to the upper part of his foot. Pt denies any difficulty walking. Pt alert & oriented, nad noted.

## 2020-09-22 NOTE — ED Triage Notes (Signed)
Foot got caught under wheel of forklift at 1700,  no swelling noted, able to bear wt

## 2020-09-22 NOTE — ED Notes (Signed)
Pt discharged home after verbalizing understanding of discharge instructions; nad noted. 

## 2020-09-22 NOTE — ED Provider Notes (Signed)
[facility] EMERGENCY DEPARTMENT Provider Note   CSN: [id number] Arrival date & time: 09/22/20  2127     History Chief Complaint  Patient presents with   Foot Injury    Franki Stemen is a 25 y.o. male presenting with right foot injury.  Patient states that he was at work earlier and was using a Neurosurgeon and his right foot got caught underneath it.  He states that this happened around 5 PM.  He states that he noticed increasing swelling since then.  He is able to walk.  No meds prior to arrival.  Denies any other injuries.  The history is provided by the patient.      Past Medical History:  Diagnosis Date   Chlamydia    Genital warts     There are no problems to display for this patient.   History reviewed. No pertinent surgical history.     History reviewed. No pertinent family history.  Social History   Tobacco Use   Smoking status: Every Day    Types: Cigars   Smokeless tobacco: Never  Vaping Use   Vaping Use: Never used  Substance Use Topics   Alcohol use: Yes   Drug use: Not Currently    Types: Marijuana    Home Medications Prior to Admission medications   Medication Sig Start Date End Date Taking? Authorizing Provider  doxycycline (VIBRAMYCIN) 100 MG capsule Take 1 capsule (100 mg total) by mouth 2 (two) times daily. 03/29/20   Rancour, Jeannett Senior, MD  oxymetazoline (AFRIN NASAL SPRAY) 0.05 % nasal spray Place 1 spray into both nostrils 2 (two) times daily as needed for congestion (USE FOR NOSEBLEEDS). 05/13/20   Curatolo, Adam, DO  sodium chloride (OCEAN) 0.65 % SOLN nasal spray Place 1 spray into both nostrils as needed for congestion. 05/13/20   Curatolo, Adam, DO  valACYclovir (VALTREX) 500 MG tablet Take 500 mg by mouth 2 (two) times daily.    [provider]    Allergies    Patient has no known allergies.  Review of Systems   Review of Systems  Musculoskeletal:        Right foot pain  All other systems reviewed and are  negative.  Physical Exam Updated Vital Signs BP 128/71 (BP Location: Right Arm)   Pulse 83   Temp 98.4 F (36.9 C) (Oral)   Resp 18   Ht 5\' 11"  (1.803 m)   Wt 82 kg   SpO2 97%   BMI 25.21 kg/m   Physical Exam Vitals and nursing note reviewed.  HENT:     Head: Normocephalic and atraumatic.     Nose: Nose normal.     Mouth/Throat:     Mouth: Mucous membranes are moist.  Eyes:     Extraocular Movements: Extraocular movements intact.     Pupils: Pupils are equal, round, and reactive to light.  Cardiovascular:     Rate and Rhythm: Normal rate.     Pulses: Normal pulses.     Heart sounds: Normal heart sounds.  Pulmonary:     Effort: Pulmonary effort is normal.     Breath sounds: Normal breath sounds.  Abdominal:     General: Abdomen is flat.     Palpations: Abdomen is soft.  Musculoskeletal:     Cervical back: Normal range of motion.     Comments: Tenderness over right second metatarsal area, patient has 2+ DP pulses.  Able to wiggle toes.  Patient able to bear weight on the  right leg.  Skin:    General: Skin is warm.     Capillary Refill: Capillary refill takes less than 2 seconds.  Neurological:     General: No focal deficit present.     Mental Status: He is alert and oriented to person, place, and time.  Psychiatric:        Mood and Affect: Mood normal.        Behavior: Behavior normal.    ED Results / Procedures / Treatments   Labs (all labs ordered are listed, but only abnormal results are displayed) Labs Reviewed - No data to display  EKG None  Radiology No results found.  Procedures Procedures   Medications Ordered in ED Medications  ibuprofen (ADVIL) tablet 800 mg (has no administration in time range)    ED Course  I have reviewed the triage vital signs and the nursing notes.  Pertinent labs & imaging results that were available during my care of the patient were reviewed by me and considered in my medical decision making (see chart for  details).    MDM Rules/Calculators/A&P                          Andrej Mundorf is a 25 y.o. male here presenting with right foot injury.  X-ray did not show any fracture.  I think likely contusion.  Stable for discharge.    Final Clinical Impression(s) / ED Diagnoses Final diagnoses:  None    Rx / DC Orders ED Discharge Orders     None        Charlynne Pander, MD 09/22/20 2256

## 2020-09-22 NOTE — Discharge Instructions (Addendum)
Your x-ray today did not show a fracture.  You likely bruised your foot. You can take Tylenol or Motrin for pain.  Expect some swelling and pain tomorrow  See your doctor for follow-up  Return to ER if you have worse right foot pain, unable to walk

## 2020-12-30 ENCOUNTER — Encounter (HOSPITAL_BASED_OUTPATIENT_CLINIC_OR_DEPARTMENT_OTHER): Payer: Self-pay

## 2020-12-30 ENCOUNTER — Other Ambulatory Visit (HOSPITAL_BASED_OUTPATIENT_CLINIC_OR_DEPARTMENT_OTHER): Payer: Self-pay

## 2020-12-30 ENCOUNTER — Emergency Department (HOSPITAL_BASED_OUTPATIENT_CLINIC_OR_DEPARTMENT_OTHER)
Admission: EM | Admit: 2020-12-30 | Discharge: 2020-12-30 | Disposition: A | Discharge status: Home / Self Care | Payer: BC Managed Care – PPO | Attending: Emergency Medicine | Admitting: Emergency Medicine

## 2020-12-30 ENCOUNTER — Other Ambulatory Visit: Payer: Self-pay

## 2020-12-30 DIAGNOSIS — M546 Pain in thoracic spine: Secondary | ICD-10-CM | POA: Diagnosis present

## 2020-12-30 DIAGNOSIS — F17290 Nicotine dependence, other tobacco product, uncomplicated: Secondary | ICD-10-CM | POA: Diagnosis not present

## 2020-12-30 MED ORDER — METHOCARBAMOL 500 MG PO TABS
500.0000 mg | ORAL_TABLET | Freq: Two times a day (BID) | ORAL | 0 refills | Status: AC | PRN
  Filled 2020-12-30: qty 10, 5d supply, fill #0

## 2020-12-30 NOTE — ED Triage Notes (Signed)
Pt presents to ED from home C/O L lower back pain after lifting boxes today. Reports chronic back pain.

## 2020-12-30 NOTE — ED Provider Notes (Signed)
[facility] EMERGENCY DEPARTMENT Provider Note   CSN: [id number] Arrival date & time: 12/30/20  1427     History Chief Complaint  Patient presents with   Back Pain    Roger Holloway is a 25 y.o. male.  HPI Patient is a 25 year old male who presents to the emergency department due to back pain.  Patient states he was performing heavy lifting at work and began experiencing his pain.  States it is along the left mid back.  Worsens with movement.  No numbness, weakness, bowel or bladder incontinence.  Reports a history of similar symptoms in the past.    Past Medical History:  Diagnosis Date   Chlamydia    Genital warts     There are no problems to display for this patient.   History reviewed. No pertinent surgical history.     No family history on file.  Social History   Tobacco Use   Smoking status: Every Day    Types: Cigars   Smokeless tobacco: Never  Vaping Use   Vaping Use: Never used  Substance Use Topics   Alcohol use: Yes    Comment: Occasionally   Drug use: Yes    Types: Marijuana    Home Medications Prior to Admission medications   Medication Sig Start Date End Date Taking? Authorizing Provider  methocarbamol (ROBAXIN) 500 MG tablet Take 1 tablet (500 mg total) by mouth 2 (two) times daily as needed for muscle spasms. 12/30/20  Yes Placido Sou, PA-C  doxycycline (VIBRAMYCIN) 100 MG capsule Take 1 capsule (100 mg total) by mouth 2 (two) times daily. 03/29/20   Rancour, Jeannett Senior, MD  oxymetazoline (AFRIN NASAL SPRAY) 0.05 % nasal spray Place 1 spray into both nostrils 2 (two) times daily as needed for congestion (USE FOR NOSEBLEEDS). 05/13/20   Curatolo, Adam, DO  sodium chloride (OCEAN) 0.65 % SOLN nasal spray Place 1 spray into both nostrils as needed for congestion. 05/13/20   Curatolo, Adam, DO  valACYclovir (VALTREX) 500 MG tablet Take 500 mg by mouth 2 (two) times daily.    [provider]    Allergies    Patient has no  known allergies.  Review of Systems   Review of Systems  Musculoskeletal:  Positive for back pain and myalgias. Negative for neck pain.  Neurological:  Negative for weakness and numbness.   Physical Exam Updated Vital Signs BP (!) 148/87 (BP Location: Right Arm)   Pulse 83   Temp 97.8 F (36.6 C) (Oral)   Resp 12   Ht 5\' 10"  (1.778 m)   Wt 89.4 kg   SpO2 99%   BMI 28.27 kg/m   Physical Exam Vitals and nursing note reviewed.  Constitutional:      General: He is not in acute distress.    Appearance: He is well-developed.  HENT:     Head: Normocephalic and atraumatic.     Right Ear: External ear normal.     Left Ear: External ear normal.  Eyes:     General: No scleral icterus.       Right eye: No discharge.        Left eye: No discharge.     Conjunctiva/sclera: Conjunctivae normal.  Neck:     Trachea: No tracheal deviation.  Cardiovascular:     Rate and Rhythm: Normal rate.  Pulmonary:     Effort: Pulmonary effort is normal. No respiratory distress.     Breath sounds: No stridor.  Abdominal:     General:  There is no distension.  Musculoskeletal:        General: Tenderness present. No swelling or deformity.     Cervical back: Neck supple.     Comments: Mild tenderness noted along the left thoracic paraspinal musculature.  No midline spine pain.  Skin:    General: Skin is warm and dry.     Findings: No rash.  Neurological:     General: No focal deficit present.     Mental Status: He is alert and oriented to person, place, and time.     Cranial Nerves: Cranial nerve deficit: no gross deficits.     Comments: Strength is 5/5 in the bilateral lower extremities.  2+ patellar DTRs.  Distal sensation intact.  2+ DP pulses.   ED Results / Procedures / Treatments   Labs (all labs ordered are listed, but only abnormal results are displayed) Labs Reviewed - No data to display  EKG None  Radiology No results found.  Procedures Procedures   Medications Ordered in  ED Medications - No data to display  ED Course  I have reviewed the triage vital signs and the nursing notes.  Pertinent labs & imaging results that were available during my care of the patient were reviewed by me and considered in my medical decision making (see chart for details).    MDM Rules/Calculators/A&P                          Patient is a 25 year old male who presents to the emergency department due to left-sided back pain that began after performing heavy lifting earlier today.   Physical exam significant for mild tenderness to the left thoracic paraspinal musculature.  No midline spine pain.  Neurovascularly intact in the bilateral lower extremities.  Patient denies any numbness, weakness, bowel or bladder incontinence.  Doubt cauda equina at this time.  No red flags.  Will discharge on a course of Robaxin.  We discussed safety regarding this medication.  He denies a history of seizures.  Recommended continued use of Tylenol as well as ibuprofen.  We discussed dosing.  We discussed return precautions.  Feel that he is stable for discharge at this time and he is agreeable.  His questions were answered and he was amicable at the time of discharge.  Final Clinical Impression(s) / ED Diagnoses Final diagnoses:  Acute left-sided thoracic back pain   Rx / DC Orders ED Discharge Orders          Ordered    methocarbamol (ROBAXIN) 500 MG tablet  2 times daily PRN        12/30/20 1459             Placido Sou, PA-C 12/30/20 1506    Virgina Norfolk, DO 12/30/20 1512

## 2020-12-30 NOTE — Discharge Instructions (Addendum)
I am prescribing you a strong muscle relaxer called Robaxin.  You can take this up to 2 times a day for management of your pain.  This medication can be sedating so do not mix it with alcohol.  Do not drive a car after taking it.  I recommend a combination of tylenol and ibuprofen for management of your pain. You can take a low dose of both at the same time. I recommend 500 mg of Tylenol combined with 600 mg of ibuprofen. This is one maximum strength Tylenol and three regular ibuprofen. You can take these 2-3 times for day for your pain. Please try to take these medications with a small amount of food as well to prevent upsetting your stomach.  Also, please consider topical pain relieving creams such as Voltaran Gel, BioFreeze, or Icy Hot. There is also a pain relieving cream made by [employer]. You should be able to find all of these at your local pharmacy.   If you develop any new or worsening symptoms please do not hesitate to return to the emergency department. It was a pleasure to meet you.

## 2021-01-07 ENCOUNTER — Emergency Department (HOSPITAL_BASED_OUTPATIENT_CLINIC_OR_DEPARTMENT_OTHER)
Admission: EM | Admit: 2021-01-07 | Discharge: 2021-01-07 | Disposition: A | Discharge status: Home / Self Care | Payer: BC Managed Care – PPO | Attending: Emergency Medicine | Admitting: Emergency Medicine

## 2021-01-07 ENCOUNTER — Encounter (HOSPITAL_BASED_OUTPATIENT_CLINIC_OR_DEPARTMENT_OTHER): Payer: Self-pay | Admitting: *Deleted

## 2021-01-07 ENCOUNTER — Other Ambulatory Visit (HOSPITAL_BASED_OUTPATIENT_CLINIC_OR_DEPARTMENT_OTHER): Payer: Self-pay

## 2021-01-07 ENCOUNTER — Emergency Department (HOSPITAL_BASED_OUTPATIENT_CLINIC_OR_DEPARTMENT_OTHER): Payer: BC Managed Care – PPO

## 2021-01-07 ENCOUNTER — Other Ambulatory Visit: Payer: Self-pay

## 2021-01-07 DIAGNOSIS — F17290 Nicotine dependence, other tobacco product, uncomplicated: Secondary | ICD-10-CM | POA: Insufficient documentation

## 2021-01-07 DIAGNOSIS — Y990 Civilian activity done for income or pay: Secondary | ICD-10-CM | POA: Diagnosis not present

## 2021-01-07 DIAGNOSIS — M545 Low back pain, unspecified: Secondary | ICD-10-CM | POA: Diagnosis present

## 2021-01-07 DIAGNOSIS — M5442 Lumbago with sciatica, left side: Secondary | ICD-10-CM | POA: Diagnosis not present

## 2021-01-07 DIAGNOSIS — Y92410 Unspecified street and highway as the place of occurrence of the external cause: Secondary | ICD-10-CM | POA: Diagnosis not present

## 2021-01-07 MED ORDER — KETOROLAC TROMETHAMINE 15 MG/ML IJ SOLN
15.0000 mg | Freq: Once | INTRAMUSCULAR | Status: AC
  Administered 2021-01-07: 15 mg via INTRAMUSCULAR
  Filled 2021-01-07: qty 1

## 2021-01-07 MED ORDER — PREDNISONE 20 MG PO TABS
40.0000 mg | ORAL_TABLET | Freq: Every day | ORAL | 0 refills | Status: AC
  Filled 2021-01-07: qty 10, 5d supply, fill #0
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
## 2021-01-07 NOTE — Discharge Instructions (Addendum)
You are prescribed steroids on today's visit.  Please take 2 tablets daily for the next 5 days.  In addition you may continue to take your muscle relaxers as prescribed.  You can also add heat, patches for pain to help with your symptoms.  If you experience any loss of bowel or bladder function, worsening symptoms you will need to return to the emergency department
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
## 2021-03-18 ENCOUNTER — Other Ambulatory Visit: Payer: Self-pay

## 2021-03-18 ENCOUNTER — Encounter (HOSPITAL_BASED_OUTPATIENT_CLINIC_OR_DEPARTMENT_OTHER): Payer: Self-pay

## 2021-03-18 ENCOUNTER — Emergency Department (HOSPITAL_BASED_OUTPATIENT_CLINIC_OR_DEPARTMENT_OTHER)
Admission: EM | Admit: 2021-03-18 | Discharge: 2021-03-18 | Disposition: A | Discharge status: Home / Self Care | Payer: BC Managed Care – PPO | Attending: Emergency Medicine | Admitting: Emergency Medicine

## 2021-03-18 DIAGNOSIS — R369 Urethral discharge, unspecified: Secondary | ICD-10-CM | POA: Insufficient documentation

## 2021-03-18 DIAGNOSIS — Z79899 Other long term (current) drug therapy: Secondary | ICD-10-CM | POA: Diagnosis not present

## 2021-03-18 LAB — URINALYSIS, MICROSCOPIC (REFLEX): WBC, UA: >50 WBC/hpf (ref 0–5)

## 2021-03-18 LAB — URINALYSIS, ROUTINE W REFLEX MICROSCOPIC
Bilirubin Urine: NEGATIVE
Glucose, UA: NEGATIVE mg/dL
Ketones, ur: NEGATIVE mg/dL
Nitrite: NEGATIVE
Protein, ur: NEGATIVE mg/dL
Specific Gravity, Urine: >=1.03 (ref 1.005–1.030)
pH: 6 (ref 5.0–8.0)

## 2021-03-18 MED ORDER — CEFTRIAXONE SODIUM 500 MG IJ SOLR
500.0000 mg | Freq: Once | INTRAMUSCULAR | Status: AC
  Administered 2021-03-18: 500 mg via INTRAMUSCULAR
  Filled 2021-03-18: qty 500

## 2021-03-18 MED ORDER — LIDOCAINE HCL (PF) 1 % IJ SOLN
1.0000 mL | Freq: Once | INTRAMUSCULAR | Status: AC
  Administered 2021-03-18: 1 mL
  Filled 2021-03-18: qty 5

## 2021-03-18 MED ORDER — DOXYCYCLINE HYCLATE 100 MG PO TABS: 100.0000 mg | ORAL_TABLET | Freq: Two times a day (BID) | ORAL | 0 refills | Status: AC

## 2021-03-18 NOTE — Discharge Instructions (Addendum)
As we discussed you presented for sexual transmitted infection testing today.  The concern is for chlamydia, gonorrhea with the symptoms that you were reporting.  We have also sent off some blood tests for syphilis, HIV.  You can follow-up with all of your test results in 24 to 48 hours on your portal.  If any of the results come back positive I recommend that you follow-up with the [place] [facility] for further evaluation.  The antibiotics I am giving you should treat for chlamydia, gonorrhea.  Please refrain from any sexual activity, and inform all recent sexual partners based on your results.  Please take the entire course of antibiotics that I am prescribing.  He will want to take some with a full stomach, and they can increase your sun sensitivity so I recommend wearing sunscreen if you are outside in the sun while taking these antibiotics.

## 2021-03-18 NOTE — ED Notes (Signed)
Rx x 1 given  Written and verbal inst to pt  Verbalized an understanding  To home  

## 2021-03-18 NOTE — ED Triage Notes (Signed)
Pt c/o burning with urination, penile discharge and pain "at the tip". Had unprotected sex prior to symptoms. Unsure of STD status.

## 2021-03-18 NOTE — ED Provider Notes (Signed)
[facility] EMERGENCY DEPARTMENT Provider Note   CSN: [id number] Arrival date & time: 03/18/21  2027     History  Chief Complaint  Patient presents with   Penile Discharge    Roger Holloway is a 26 y.o. male reports sexual contact with partner, concern for sexual transmitted infection around 1 to 2 weeks ago.  Since then patient has had some burning with urination as well as white discharge from the head of penis.  Patient denies any blood from penis.  Patient denies any fatigue, fever, weight loss, or other concerns.  Patient has not been recently screened for HIV, syphilis.  Patient unclear of STI status of his partner.   Penile Discharge      Home Medications Prior to Admission medications   Medication Sig Start Date End Date Taking? Authorizing Provider  doxycycline (VIBRA-TABS) 100 MG tablet Take 1 tablet (100 mg total) by mouth 2 (two) times daily. 03/18/21  Yes Diavion Labrador H, PA-C  methocarbamol (ROBAXIN) 500 MG tablet Take 1 tablet (500 mg total) by mouth 2 (two) times daily as needed for muscle spasms. 12/30/20   Placido Sou, PA-C  oxymetazoline (AFRIN NASAL SPRAY) 0.05 % nasal spray Place 1 spray into both nostrils 2 (two) times daily as needed for congestion (USE FOR NOSEBLEEDS). 05/13/20   Curatolo, Adam, DO  sodium chloride (OCEAN) 0.65 % SOLN nasal spray Place 1 spray into both nostrils as needed for congestion. 05/13/20   Curatolo, Adam, DO  valACYclovir (VALTREX) 500 MG tablet Take 500 mg by mouth 2 (two) times daily.    [provider]      Allergies    Patient has no known allergies.    Review of Systems   Review of Systems  Genitourinary:  Positive for dysuria and penile discharge.  All other systems reviewed and are negative.  Physical Exam Updated Vital Signs BP (!) 148/82 (BP Location: Right Arm)    Pulse 99    Temp 99.1 F (37.3 C) (Oral)    Resp 16    Ht 5\' 10"  (1.778 m)    Wt 84.8 kg    SpO2 99%    BMI 26.83 kg/m   Physical Exam Vitals and nursing note reviewed.  Constitutional:      General: He is not in acute distress.    Appearance: Normal appearance.  HENT:     Head: Normocephalic and atraumatic.  Eyes:     General:        Right eye: No discharge.        Left eye: No discharge.  Cardiovascular:     Rate and Rhythm: Normal rate and regular rhythm.  Pulmonary:     Effort: Pulmonary effort is normal. No respiratory distress.  Genitourinary:    Penis: Normal.      Testes: Normal.  Musculoskeletal:        General: No deformity.  Skin:    General: Skin is warm and dry.  Neurological:     Mental Status: He is alert and oriented to person, place, and time.  Psychiatric:        Mood and Affect: Mood normal.        Behavior: Behavior normal.    ED Results / Procedures / Treatments   Labs (all labs ordered are listed, but only abnormal results are displayed) Labs Reviewed  URINALYSIS, ROUTINE W REFLEX MICROSCOPIC - Abnormal; Notable for the following components:      Result Value   APPearance CLOUDY (*)  Hgb urine dipstick TRACE (*)    Leukocytes,Ua MODERATE (*)    All other components within normal limits  URINALYSIS, MICROSCOPIC (REFLEX) - Abnormal; Notable for the following components:   Bacteria, UA RARE (*)    All other components within normal limits  HIV ANTIBODY (ROUTINE TESTING W REFLEX)  RPR  GC/CHLAMYDIA PROBE AMP ([place]) NOT AT [facility]    EKG None  Radiology No results found.  Procedures Procedures    Medications Ordered in ED Medications  cefTRIAXone (ROCEPHIN) injection 500 mg (has no administration in time range)  lidocaine (PF) (XYLOCAINE) 1 % injection 1 mL (has no administration in time range)    ED Course/ Medical Decision Making/ A&P                           Medical Decision Making Amount and/or Complexity of Data Reviewed Labs: ordered.   There is an overall well-appearing male with concern for penile discharge, with unprotected sex  prior to symptoms.  Does endorse some dysuria.  No recent testing for HIV, RPR.  My physical exam does not reveal significant discharge, or other abnormality of the penis.  No inguinal lymphadenopathy, no testicular abnormality.  We will send off additional testing for RPR, HIV.  Pending results of GC, chlamydia urine at this time.  Urinalysis is significant for some leukocytes, hemoglobin, bacteria, that would be consistent with possible STI.  Patient with no suprapubic tenderness palpation, or tenderness of the flanks.  Minimal clinical concern for acute UTI or pyelonephritis.  We will treat prophylactically after shared decision making for gonorrhea, chlamydia.  Encouraged patient to follow-up on his RPR, HIV, GC chlamydia results in 24 to 48 hours, inform all sexual partners, refrain from sexual activity until symptoms resolve, as well as finish his entire course of antibiotics.  Patient discharged in stable condition at this time, return precautions given. Final Clinical Impression(s) / ED Diagnoses Final diagnoses:  Penile discharge    Rx / DC Orders ED Discharge Orders          Ordered    doxycycline (VIBRA-TABS) 100 MG tablet  2 times daily        03/18/21 2241              Olene Floss, PA-C 03/18/21 2247    Gwyneth Sprout, MD 03/19/21 1651

## 2021-03-19 LAB — HIV ANTIBODY (ROUTINE TESTING W REFLEX): HIV Screen 4th Generation wRfx: NONREACTIVE

## 2021-03-19 LAB — RPR: RPR Ser Ql: NONREACTIVE

## 2021-03-19 LAB — GC/CHLAMYDIA PROBE AMP (~~LOC~~) NOT AT ARMC
Chlamydia: NEGATIVE
Comment: NEGATIVE
Comment: NORMAL
Neisseria Gonorrhea: POSITIVE — AB

## 2022-05-03 ENCOUNTER — Other Ambulatory Visit (HOSPITAL_COMMUNITY): Payer: Self-pay
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
## 2023-09-15 ENCOUNTER — Encounter (HOSPITAL_BASED_OUTPATIENT_CLINIC_OR_DEPARTMENT_OTHER): Payer: Self-pay | Admitting: Radiology

## 2023-09-15 ENCOUNTER — Emergency Department (HOSPITAL_BASED_OUTPATIENT_CLINIC_OR_DEPARTMENT_OTHER)
Admission: EM | Admit: 2023-09-15 | Discharge: 2023-09-15 | Disposition: A | Discharge status: Home / Self Care | Attending: Emergency Medicine | Admitting: Emergency Medicine

## 2023-09-15 ENCOUNTER — Other Ambulatory Visit: Payer: Self-pay

## 2023-09-15 DIAGNOSIS — B009 Herpesviral infection, unspecified: Secondary | ICD-10-CM | POA: Insufficient documentation

## 2023-09-15 DIAGNOSIS — R300 Dysuria: Secondary | ICD-10-CM | POA: Diagnosis present

## 2023-09-15 LAB — GC/CHLAMYDIA PROBE AMP (~~LOC~~) NOT AT ARMC
Chlamydia: NEGATIVE
Comment: NEGATIVE
Comment: NORMAL
Neisseria Gonorrhea: NEGATIVE

## 2023-09-15 LAB — RAPID HIV SCREEN (HIV 1/2 AB+AG)
HIV 1/2 Antibodies: NONREACTIVE
HIV-1 P24 Antigen - HIV24: NONREACTIVE

## 2023-09-15 MED ORDER — VALACYCLOVIR HCL 500 MG PO TABS
1000.0000 mg | ORAL_TABLET | Freq: Once | ORAL | Status: AC
  Administered 2023-09-15: 1000 mg via ORAL
  Filled 2023-09-15: qty 2

## 2023-09-15 MED ORDER — VALACYCLOVIR HCL 1 G PO TABS: 1000.0000 mg | ORAL_TABLET | Freq: Two times a day (BID) | ORAL | 0 refills | Status: AC

## 2023-09-15 NOTE — ED Provider Notes (Signed)
 [place] EMERGENCY DEPARTMENT AT [facility] Provider Note   CSN: [id number] Arrival date & time: 09/15/23  9766     History Chief Complaint  Patient presents with   Herpes Zoster    HPI Roger Holloway is a 28 y.o. male presenting for chief complaint of dysuria.  He states that he has history of zoster infection and feels like he is having an outbreak.  Ran out of Valtrex .  Used to use it in the outpatient setting.  Has been an outbreak in a few years.  Denies fevers chills nausea vomiting syncope shortness of breath..   Patient's recorded medical, surgical, social, medication list and allergies were reviewed in the Snapshot window as part of the initial history.   Review of Systems   Review of Systems  Constitutional:  Negative for chills and fever.  HENT:  Negative for ear pain and sore throat.   Eyes:  Negative for pain and visual disturbance.  Respiratory:  Negative for cough and shortness of breath.   Cardiovascular:  Negative for chest pain and palpitations.  Gastrointestinal:  Negative for abdominal pain and vomiting.  Genitourinary:  Positive for penile pain. Negative for dysuria and hematuria.  Musculoskeletal:  Negative for arthralgias and back pain.  Skin:  Negative for color change and rash.  Neurological:  Negative for seizures and syncope.  All other systems reviewed and are negative.   Physical Exam Updated Vital Signs BP 123/69 (BP Location: Right Arm)   Pulse 92   Temp 98.1 F (36.7 C) (Oral)   Resp 17   Ht 5' 10 (1.778 m)   Wt 89.2 kg   SpO2 99%   BMI 28.21 kg/m  Physical Exam Vitals and nursing note reviewed.  Constitutional:      General: He is not in acute distress.    Appearance: He is well-developed.  HENT:     Head: Normocephalic and atraumatic.  Eyes:     Conjunctiva/sclera: Conjunctivae normal.  Cardiovascular:     Rate and Rhythm: Normal rate and regular rhythm.     Heart sounds: No murmur heard. Pulmonary:      Effort: Pulmonary effort is normal. No respiratory distress.     Breath sounds: Normal breath sounds.  Abdominal:     Palpations: Abdomen is soft.     Tenderness: There is no abdominal tenderness.  Genitourinary:    Comments: No testicular pain.  Visible vesicular lesions on the left lateral dorsum of the penis. Musculoskeletal:        General: No swelling.     Cervical back: Neck supple.  Skin:    General: Skin is warm and dry.     Capillary Refill: Capillary refill takes less than 2 seconds.  Neurological:     Mental Status: He is alert.  Psychiatric:        Mood and Affect: Mood normal.      ED Course/ Medical Decision Making/ A&P    Procedures Procedures   Medications Ordered in ED Medications  valACYclovir  (VALTREX ) tablet 1,000 mg (1,000 mg Oral Given 09/15/23 0315)    Medical Decision Making:   28 year old male requesting STI testing.  He is also requesting treatment for HSV. He consented for treatment/testing. Will have him reswab as he has never had a positive HSV test though it does appear to be herpes infection. Will treat with Valtrex  and send testing for GC/RPR/HIV with plan for ambulatory follow-up per departmental policy. Disposition:  I have considered need for hospitalization, however,  considering all of the above, I believe this patient is stable for discharge at this time.  Patient/family educated about specific return precautions for given chief complaint and symptoms.  Patient/family educated about follow-up with PCP.     Patient/family expressed understanding of return precautions and need for follow-up. Patient spoken to regarding all imaging and laboratory results and appropriate follow up for these results. All education provided in verbal form with additional information in written form. Time was allowed for answering of patient questions. Patient discharged.    Emergency Department Medication Summary:   Medications  valACYclovir  (VALTREX )  tablet 1,000 mg (1,000 mg Oral Given 09/15/23 0315)        Clinical Impression:  1. HSV (herpes simplex virus) infection      Discharge   Final Clinical Impression(s) / ED Diagnoses Final diagnoses:  HSV (herpes simplex virus) infection    Rx / DC Orders ED Discharge Orders          Ordered    valACYclovir  (VALTREX ) 1000 MG tablet  2 times daily        09/15/23 0258              Jerral Meth, MD 09/15/23 [phone]

## 2023-09-15 NOTE — ED Triage Notes (Signed)
 Pt states he is having a herpes flare up and needs the medication. He states that he is very sore in his genitals. He [place] it while [place] the beach and felt it coming. He also wants testing for all other std's while he is here.

## 2023-09-16 LAB — HSV 1/2 PCR (SURFACE)
HSV-1 DNA: NOT DETECTED
HSV-2 DNA: DETECTED — AB

## 2023-09-16 LAB — RPR: RPR Ser Ql: NONREACTIVE

## 2023-09-18 ENCOUNTER — Encounter (HOSPITAL_BASED_OUTPATIENT_CLINIC_OR_DEPARTMENT_OTHER): Payer: Self-pay

## 2023-09-18 ENCOUNTER — Other Ambulatory Visit: Payer: Self-pay

## 2023-09-18 ENCOUNTER — Emergency Department (HOSPITAL_BASED_OUTPATIENT_CLINIC_OR_DEPARTMENT_OTHER)
Admission: EM | Admit: 2023-09-18 | Discharge: 2023-09-18 | Disposition: A | Discharge status: Home / Self Care | Attending: Emergency Medicine | Admitting: Emergency Medicine

## 2023-09-18 DIAGNOSIS — N4889 Other specified disorders of penis: Secondary | ICD-10-CM | POA: Diagnosis present

## 2023-09-18 DIAGNOSIS — A6000 Herpesviral infection of urogenital system, unspecified: Secondary | ICD-10-CM

## 2023-09-18 MED ORDER — PREDNISONE 20 MG PO TABS
40.0000 mg | ORAL_TABLET | Freq: Once | ORAL | Status: AC
  Administered 2023-09-18: 40 mg via ORAL
  Filled 2023-09-18: qty 2

## 2023-09-18 MED ORDER — PREDNISONE 10 MG PO TABS
20.0000 mg | ORAL_TABLET | Freq: Two times a day (BID) | ORAL | 0 refills | Status: DC
  Filled 2023-09-18: qty 20, 5d supply, fill #0

## 2023-09-18 MED ORDER — PREDNISONE 10 MG PO TABS: 20.0000 mg | ORAL_TABLET | Freq: Two times a day (BID) | ORAL | 0 refills | Status: AC

## 2023-09-18 NOTE — ED Triage Notes (Signed)
 PT complaints of genital sores that does not improve with medications, pt was seen on July 30.   Alert and orient, pain 7 out of 10 .

## 2023-09-18 NOTE — Discharge Instructions (Signed)
 Begin taking prednisone  as prescribed.  Increase Valtrex  to 1000 mg 3 times daily until the prescription is gone.  Return to the ER if symptoms significantly worsen or change.

## 2023-09-18 NOTE — ED Provider Notes (Signed)
  [place] [facility]GENC[facility] DE[facility]MENT [facility] [place] [facility] [place]   CSN: [id number] Arrival date & time: 09/18/23  9472     Patient presents with: No chief complaint on file.   Roger Holloway is a 28 y.o. male.   Patient is a 28 year old male with history of genital herpes.  Patient currently experiencing an outbreak.  He was seen here 3 days ago and prescribed Valtrex  which she has been taking twice daily.  He presents with ongoing discomfort to his penis and testicles.  He denies any dysuria.  No fevers or chills.  He states the lesions are actually improving, however the discomfort persists.       Prior to Admission medications   Medication Sig Start Date End Date Taking? Authorizing Provider  doxycycline  (VIBRA -TABS) 100 MG tablet Take 1 tablet (100 mg total) by mouth 2 (two) times daily. 03/18/21   Prosperi, Christian H, PA-C  methocarbamol  (ROBAXIN ) 500 MG tablet Take 1 tablet (500 mg total) by mouth 2 (two) times daily as needed for muscle spasms. 12/30/20   Joldersma, Logan, PA-C  oxymetazoline (AFRIN NASAL SPRAY) 0.05 % nasal spray Place 1 spray into both nostrils 2 (two) times daily as needed for congestion (USE FOR NOSEBLEEDS). 05/13/20   Curatolo, Adam, DO  sodium chloride (OCEAN) 0.65 % SOLN nasal spray Place 1 spray into both nostrils as needed for congestion. 05/13/20   Curatolo, Adam, DO  valACYclovir  (VALTREX ) 1000 MG tablet Take 1 tablet (1,000 mg total) by mouth 2 (two) times daily. 09/15/23   Jerral Meth, MD  valACYclovir  (VALTREX ) 500 MG tablet Take 500 mg by mouth 2 (two) times daily.    [provider]    Allergies: Patient has no known allergies.    Review of Systems  All other systems reviewed and are negative.   Updated Vital Signs BP (!) 141/58 (BP Location: Left Arm)   Pulse 85   Temp 98.1 F (36.7 C) (Oral)   Resp 20   SpO2 100%   Physical Exam Vitals and nursing note reviewed.  Constitutional:      Appearance: Normal  appearance.  Pulmonary:     Effort: Pulmonary effort is normal.  Genitourinary:    Testes: Normal.     Comments: There are several healing herpetic lesions noted to the shaft of his penis.  Testicular exam basically unremarkable. Neurological:     Mental Status: He is alert.     (all labs ordered are listed, but only abnormal results are displayed) Labs Reviewed - No data to display  EKG: None  Radiology: No results found.   Procedures   Medications Ordered in the ED  predniSONE  (DELTASONE ) tablet 40 mg (has no administration in time range)                                    Medical Decision Making Risk Prescription drug management.   Patient with ongoing discomfort during a herpes outbreak.  I will increase his Valtrex  to 3 times daily and also prescribed prednisone .  Patient to follow-up as needed.     Final diagnoses:  None    ED Discharge Orders     None          Geroldine Berg, MD 09/18/23 [phone]

## 2023-09-19 ENCOUNTER — Other Ambulatory Visit (HOSPITAL_BASED_OUTPATIENT_CLINIC_OR_DEPARTMENT_OTHER): Payer: Self-pay
